# Patient Record
Sex: Male | Born: 1935 | Race: Black or African American | Hispanic: No | Marital: Single | Smoking: Never smoker
Health system: Southern US, Community
[De-identification: ages and names within clinical notes are randomized; demographics above are authoritative.]

## PROBLEM LIST (undated history)

## (undated) DIAGNOSIS — R4701 Aphasia: Secondary | ICD-10-CM

## (undated) DIAGNOSIS — N179 Acute kidney failure, unspecified: Secondary | ICD-10-CM

## (undated) DIAGNOSIS — N390 Urinary tract infection, site not specified: Secondary | ICD-10-CM

## (undated) DIAGNOSIS — I2510 Atherosclerotic heart disease of native coronary artery without angina pectoris: Secondary | ICD-10-CM

## (undated) DIAGNOSIS — N189 Chronic kidney disease, unspecified: Secondary | ICD-10-CM

## (undated) DIAGNOSIS — E785 Hyperlipidemia, unspecified: Secondary | ICD-10-CM

## (undated) DIAGNOSIS — H9190 Unspecified hearing loss, unspecified ear: Secondary | ICD-10-CM

## (undated) DIAGNOSIS — N401 Enlarged prostate with lower urinary tract symptoms: Secondary | ICD-10-CM

## (undated) DIAGNOSIS — D649 Anemia, unspecified: Secondary | ICD-10-CM

## (undated) DIAGNOSIS — R627 Adult failure to thrive: Secondary | ICD-10-CM

## (undated) DIAGNOSIS — K219 Gastro-esophageal reflux disease without esophagitis: Secondary | ICD-10-CM

## (undated) DIAGNOSIS — E875 Hyperkalemia: Secondary | ICD-10-CM

## (undated) DIAGNOSIS — I10 Essential (primary) hypertension: Secondary | ICD-10-CM

## (undated) DIAGNOSIS — H547 Unspecified visual loss: Secondary | ICD-10-CM

## (undated) DIAGNOSIS — C649 Malignant neoplasm of unspecified kidney, except renal pelvis: Secondary | ICD-10-CM

## (undated) HISTORY — DX: Atherosclerotic heart disease of native coronary artery without angina pectoris: I25.10

## (undated) HISTORY — DX: Unspecified visual loss: H54.7

## (undated) HISTORY — DX: Unspecified hearing loss, unspecified ear: H91.90

## (undated) HISTORY — DX: Hyperlipidemia, unspecified: E78.5

## (undated) HISTORY — DX: Aphasia: R47.01

## (undated) HISTORY — DX: Gastro-esophageal reflux disease without esophagitis: K21.9

## (undated) HISTORY — PX: OTHER SURGICAL HISTORY: SHX169

## (undated) HISTORY — DX: Malignant neoplasm of unspecified kidney, except renal pelvis: C64.9

## (undated) HISTORY — DX: Chronic kidney disease, unspecified: N18.9

## (undated) HISTORY — PX: CHOLECYSTECTOMY: SHX55

## (undated) HISTORY — DX: Essential (primary) hypertension: I10

---

## 2001-09-22 ENCOUNTER — Inpatient Hospital Stay (HOSPITAL_COMMUNITY): Admission: EM | Admit: 2001-09-22 | Discharge: 2001-09-24 | Payer: Self-pay | Admitting: Emergency Medicine

## 2001-09-22 ENCOUNTER — Encounter: Payer: Self-pay | Admitting: Emergency Medicine

## 2001-10-08 ENCOUNTER — Encounter: Admission: RE | Admit: 2001-10-08 | Discharge: 2001-10-08 | Payer: Self-pay

## 2001-10-15 ENCOUNTER — Ambulatory Visit (HOSPITAL_COMMUNITY): Admission: RE | Admit: 2001-10-15 | Discharge: 2001-10-15 | Payer: Self-pay | Admitting: Internal Medicine

## 2001-10-15 ENCOUNTER — Encounter: Admission: RE | Admit: 2001-10-15 | Discharge: 2001-10-15 | Payer: Self-pay | Admitting: Internal Medicine

## 2001-10-22 ENCOUNTER — Encounter: Admission: RE | Admit: 2001-10-22 | Discharge: 2001-10-22 | Payer: Self-pay | Admitting: Internal Medicine

## 2001-10-28 ENCOUNTER — Ambulatory Visit (HOSPITAL_COMMUNITY): Admission: RE | Admit: 2001-10-28 | Discharge: 2001-10-28 | Payer: Self-pay | Admitting: Internal Medicine

## 2001-10-28 ENCOUNTER — Encounter: Payer: Self-pay | Admitting: Internal Medicine

## 2001-10-29 ENCOUNTER — Encounter: Admission: RE | Admit: 2001-10-29 | Discharge: 2001-10-29 | Payer: Self-pay | Admitting: Internal Medicine

## 2001-11-02 ENCOUNTER — Encounter: Admission: RE | Admit: 2001-11-02 | Discharge: 2001-11-02 | Payer: Self-pay | Admitting: Internal Medicine

## 2001-11-02 ENCOUNTER — Ambulatory Visit (HOSPITAL_COMMUNITY): Admission: RE | Admit: 2001-11-02 | Discharge: 2001-11-02 | Payer: Self-pay | Admitting: Internal Medicine

## 2001-11-02 ENCOUNTER — Encounter: Payer: Self-pay | Admitting: Internal Medicine

## 2001-11-23 ENCOUNTER — Inpatient Hospital Stay (HOSPITAL_COMMUNITY): Admission: RE | Admit: 2001-11-23 | Discharge: 2001-11-28 | Payer: Self-pay | Admitting: Urology

## 2001-11-23 ENCOUNTER — Encounter: Payer: Self-pay | Admitting: Urology

## 2001-11-23 ENCOUNTER — Encounter (INDEPENDENT_AMBULATORY_CARE_PROVIDER_SITE_OTHER): Payer: Self-pay | Admitting: Specialist

## 2001-11-25 ENCOUNTER — Encounter: Payer: Self-pay | Admitting: Internal Medicine

## 2002-05-31 ENCOUNTER — Encounter: Admission: RE | Admit: 2002-05-31 | Discharge: 2002-05-31 | Payer: Self-pay | Admitting: Internal Medicine

## 2002-07-15 ENCOUNTER — Encounter: Admission: RE | Admit: 2002-07-15 | Discharge: 2002-07-15 | Payer: Self-pay | Admitting: Internal Medicine

## 2002-12-03 ENCOUNTER — Encounter: Admission: RE | Admit: 2002-12-03 | Discharge: 2002-12-03 | Payer: Self-pay | Admitting: Internal Medicine

## 2002-12-10 ENCOUNTER — Encounter: Admission: RE | Admit: 2002-12-10 | Discharge: 2002-12-10 | Payer: Self-pay | Admitting: Internal Medicine

## 2002-12-10 ENCOUNTER — Ambulatory Visit (HOSPITAL_COMMUNITY): Admission: RE | Admit: 2002-12-10 | Discharge: 2002-12-10 | Payer: Self-pay | Admitting: Internal Medicine

## 2002-12-14 ENCOUNTER — Encounter: Admission: RE | Admit: 2002-12-14 | Discharge: 2002-12-14 | Payer: Self-pay | Admitting: Internal Medicine

## 2003-08-04 ENCOUNTER — Encounter: Admission: RE | Admit: 2003-08-04 | Discharge: 2003-08-04 | Payer: Self-pay | Admitting: Internal Medicine

## 2003-08-04 ENCOUNTER — Ambulatory Visit (HOSPITAL_COMMUNITY): Admission: RE | Admit: 2003-08-04 | Discharge: 2003-08-04 | Payer: Self-pay | Admitting: Internal Medicine

## 2003-08-10 ENCOUNTER — Encounter: Admission: RE | Admit: 2003-08-10 | Discharge: 2003-08-10 | Payer: Self-pay | Admitting: Internal Medicine

## 2004-08-16 ENCOUNTER — Inpatient Hospital Stay (HOSPITAL_COMMUNITY): Admission: EM | Admit: 2004-08-16 | Discharge: 2004-08-30 | Payer: Self-pay | Admitting: Emergency Medicine

## 2004-08-16 ENCOUNTER — Ambulatory Visit: Payer: Self-pay | Admitting: Internal Medicine

## 2004-08-20 ENCOUNTER — Encounter (INDEPENDENT_AMBULATORY_CARE_PROVIDER_SITE_OTHER): Payer: Self-pay | Admitting: *Deleted

## 2004-08-22 ENCOUNTER — Encounter (INDEPENDENT_AMBULATORY_CARE_PROVIDER_SITE_OTHER): Payer: Self-pay | Admitting: Specialist

## 2004-09-04 ENCOUNTER — Ambulatory Visit: Payer: Self-pay | Admitting: Internal Medicine

## 2004-09-11 ENCOUNTER — Ambulatory Visit: Payer: Self-pay | Admitting: Internal Medicine

## 2004-09-18 ENCOUNTER — Ambulatory Visit: Payer: Self-pay | Admitting: Internal Medicine

## 2005-03-28 ENCOUNTER — Ambulatory Visit: Payer: Self-pay

## 2005-03-28 ENCOUNTER — Ambulatory Visit (HOSPITAL_COMMUNITY): Admission: RE | Admit: 2005-03-28 | Discharge: 2005-03-28 | Payer: Self-pay | Admitting: Internal Medicine

## 2005-04-04 ENCOUNTER — Ambulatory Visit: Payer: Self-pay | Admitting: Internal Medicine

## 2005-04-09 ENCOUNTER — Ambulatory Visit: Payer: Self-pay | Admitting: Internal Medicine

## 2005-08-28 ENCOUNTER — Ambulatory Visit: Payer: Self-pay | Admitting: Internal Medicine

## 2005-08-28 ENCOUNTER — Ambulatory Visit (HOSPITAL_COMMUNITY): Admission: RE | Admit: 2005-08-28 | Discharge: 2005-08-28 | Payer: Self-pay | Admitting: Internal Medicine

## 2005-09-05 ENCOUNTER — Ambulatory Visit (HOSPITAL_COMMUNITY): Admission: RE | Admit: 2005-09-05 | Discharge: 2005-09-05 | Payer: Self-pay | Admitting: *Deleted

## 2005-09-05 ENCOUNTER — Encounter (INDEPENDENT_AMBULATORY_CARE_PROVIDER_SITE_OTHER): Payer: Self-pay | Admitting: *Deleted

## 2005-09-09 ENCOUNTER — Ambulatory Visit: Payer: Self-pay | Admitting: Internal Medicine

## 2005-10-07 ENCOUNTER — Ambulatory Visit: Payer: Self-pay | Admitting: Internal Medicine

## 2005-10-16 ENCOUNTER — Ambulatory Visit: Payer: Self-pay | Admitting: Internal Medicine

## 2005-12-18 ENCOUNTER — Ambulatory Visit: Payer: Self-pay | Admitting: Internal Medicine

## 2006-01-06 ENCOUNTER — Ambulatory Visit: Payer: Self-pay | Admitting: Internal Medicine

## 2006-01-08 ENCOUNTER — Ambulatory Visit: Payer: Self-pay | Admitting: Internal Medicine

## 2006-01-15 ENCOUNTER — Ambulatory Visit: Payer: Self-pay | Admitting: Hospitalist

## 2006-03-24 ENCOUNTER — Inpatient Hospital Stay (HOSPITAL_COMMUNITY): Admission: EM | Admit: 2006-03-24 | Discharge: 2006-03-27 | Payer: Self-pay | Admitting: Emergency Medicine

## 2006-03-24 ENCOUNTER — Ambulatory Visit: Payer: Self-pay | Admitting: Internal Medicine

## 2006-03-25 ENCOUNTER — Encounter (INDEPENDENT_AMBULATORY_CARE_PROVIDER_SITE_OTHER): Payer: Self-pay | Admitting: Cardiology

## 2006-04-01 ENCOUNTER — Ambulatory Visit: Payer: Self-pay | Admitting: Internal Medicine

## 2006-04-08 ENCOUNTER — Encounter: Payer: Self-pay | Admitting: Internal Medicine

## 2006-04-10 ENCOUNTER — Ambulatory Visit: Payer: Self-pay | Admitting: Internal Medicine

## 2006-04-10 DIAGNOSIS — I2510 Atherosclerotic heart disease of native coronary artery without angina pectoris: Secondary | ICD-10-CM | POA: Insufficient documentation

## 2006-04-10 DIAGNOSIS — R197 Diarrhea, unspecified: Secondary | ICD-10-CM

## 2006-04-10 DIAGNOSIS — H913 Deaf nonspeaking, not elsewhere classified: Secondary | ICD-10-CM

## 2006-04-10 DIAGNOSIS — K219 Gastro-esophageal reflux disease without esophagitis: Secondary | ICD-10-CM

## 2006-04-10 DIAGNOSIS — E7800 Pure hypercholesterolemia, unspecified: Secondary | ICD-10-CM

## 2006-04-10 DIAGNOSIS — K8050 Calculus of bile duct without cholangitis or cholecystitis without obstruction: Secondary | ICD-10-CM | POA: Insufficient documentation

## 2006-04-10 DIAGNOSIS — I10 Essential (primary) hypertension: Secondary | ICD-10-CM | POA: Insufficient documentation

## 2006-04-10 DIAGNOSIS — Z9089 Acquired absence of other organs: Secondary | ICD-10-CM

## 2006-04-10 DIAGNOSIS — H543 Unqualified visual loss, both eyes: Secondary | ICD-10-CM | POA: Insufficient documentation

## 2006-04-10 HISTORY — DX: Atherosclerotic heart disease of native coronary artery without angina pectoris: I25.10

## 2006-11-13 ENCOUNTER — Telehealth (INDEPENDENT_AMBULATORY_CARE_PROVIDER_SITE_OTHER): Payer: Self-pay | Admitting: *Deleted

## 2007-01-22 ENCOUNTER — Telehealth (INDEPENDENT_AMBULATORY_CARE_PROVIDER_SITE_OTHER): Payer: Self-pay | Admitting: *Deleted

## 2007-01-30 ENCOUNTER — Encounter (INDEPENDENT_AMBULATORY_CARE_PROVIDER_SITE_OTHER): Payer: Self-pay | Admitting: *Deleted

## 2007-01-30 ENCOUNTER — Ambulatory Visit: Payer: Self-pay | Admitting: Infectious Disease

## 2007-01-30 DIAGNOSIS — R4182 Altered mental status, unspecified: Secondary | ICD-10-CM

## 2007-01-30 LAB — CONVERTED CEMR LAB
ALT: <8 units/L (ref 0–53)
AST: 11 units/L (ref 0–37)
Albumin: 4.2 g/dL (ref 3.5–5.2)
Alkaline Phosphatase: 63 units/L (ref 39–117)
BUN: 15 mg/dL (ref 6–23)
Basophils Absolute: 0 10*3/uL (ref 0.0–0.1)
Basophils Relative: 0 % (ref 0–1)
CO2: 27 meq/L (ref 19–32)
Calcium: 9.5 mg/dL (ref 8.4–10.5)
Chloride: 104 meq/L (ref 96–112)
Creatinine, Ser: 1.01 mg/dL (ref 0.40–1.50)
Eosinophils Absolute: 0.1 10*3/uL (ref 0.0–0.7)
Eosinophils Relative: 1 % (ref 0–5)
Glucose, Bld: 85 mg/dL (ref 70–99)
HCT: 45.9 % (ref 39.0–52.0)
Hemoglobin: 15.6 g/dL (ref 13.0–17.0)
Lymphocytes Relative: 30 % (ref 12–46)
Lymphs Abs: 2 10*3/uL (ref 0.7–3.3)
MCHC: 34 g/dL (ref 30.0–36.0)
MCV: 86.6 fL (ref 78.0–100.0)
Monocytes Absolute: 0.5 10*3/uL (ref 0.2–0.7)
Monocytes Relative: 7 % (ref 3–11)
Neutro Abs: 4.2 10*3/uL (ref 1.7–7.7)
Neutrophils Relative %: 62 % (ref 43–77)
Platelets: 210 10*3/uL (ref 150–400)
Potassium: 4 meq/L (ref 3.5–5.3)
RBC: 5.3 M/uL (ref 4.22–5.81)
RDW: 13.9 % (ref 11.5–14.0)
Sed Rate: 13 mm/hr (ref 0–16)
Sodium: 142 meq/L (ref 135–145)
Total Bilirubin: 1 mg/dL (ref 0.3–1.2)
Total Protein: 7.3 g/dL (ref 6.0–8.3)
WBC: 6.8 10*3/uL (ref 4.0–10.5)

## 2007-02-03 ENCOUNTER — Ambulatory Visit (HOSPITAL_COMMUNITY): Admission: RE | Admit: 2007-02-03 | Discharge: 2007-02-03 | Payer: Self-pay | Admitting: *Deleted

## 2007-02-06 ENCOUNTER — Encounter (INDEPENDENT_AMBULATORY_CARE_PROVIDER_SITE_OTHER): Payer: Self-pay | Admitting: *Deleted

## 2007-02-06 ENCOUNTER — Ambulatory Visit: Payer: Self-pay | Admitting: Internal Medicine

## 2007-02-07 DIAGNOSIS — C649 Malignant neoplasm of unspecified kidney, except renal pelvis: Secondary | ICD-10-CM

## 2007-02-07 LAB — CONVERTED CEMR LAB
Free T4: 1.46 ng/dL (ref 0.89–1.80)
TSH: 1.492 microintl units/mL (ref 0.350–5.50)
Vitamin B-12: 464 pg/mL (ref 211–911)

## 2007-04-15 ENCOUNTER — Encounter: Payer: Self-pay | Admitting: Internal Medicine

## 2007-05-29 ENCOUNTER — Telehealth: Payer: Self-pay | Admitting: Internal Medicine

## 2007-06-03 ENCOUNTER — Telehealth: Payer: Self-pay | Admitting: Internal Medicine

## 2007-11-09 ENCOUNTER — Encounter (INDEPENDENT_AMBULATORY_CARE_PROVIDER_SITE_OTHER): Payer: Self-pay | Admitting: *Deleted

## 2007-11-09 ENCOUNTER — Ambulatory Visit: Payer: Self-pay | Admitting: Internal Medicine

## 2007-11-09 DIAGNOSIS — K625 Hemorrhage of anus and rectum: Secondary | ICD-10-CM

## 2007-11-09 DIAGNOSIS — R319 Hematuria, unspecified: Secondary | ICD-10-CM

## 2007-11-09 DIAGNOSIS — R195 Other fecal abnormalities: Secondary | ICD-10-CM

## 2007-11-09 LAB — CONVERTED CEMR LAB
Basophils Absolute: 0 10*3/uL (ref 0.0–0.1)
Basophils Relative: 0 % (ref 0–1)
Eosinophils Absolute: 0.1 10*3/uL (ref 0.0–0.7)
Eosinophils Relative: 1 % (ref 0–5)
HCT: 46 % (ref 39.0–52.0)
Hemoglobin: 15.9 g/dL (ref 13.0–17.0)
Lymphocytes Relative: 42 % (ref 12–46)
Lymphs Abs: 3.6 10*3/uL (ref 0.7–4.0)
MCHC: 34.6 g/dL (ref 30.0–36.0)
MCV: 88.7 fL (ref 78.0–100.0)
Monocytes Absolute: 0.8 10*3/uL (ref 0.1–1.0)
Monocytes Relative: 9 % (ref 3–12)
Neutro Abs: 4.2 10*3/uL (ref 1.7–7.7)
Neutrophils Relative %: 48 % (ref 43–77)
Platelets: 157 10*3/uL (ref 150–400)
RBC: 5.19 M/uL (ref 4.22–5.81)
RDW: 13.9 % (ref 11.5–15.5)
WBC: 8.7 10*3/uL (ref 4.0–10.5)

## 2007-11-12 ENCOUNTER — Ambulatory Visit (HOSPITAL_COMMUNITY): Admission: RE | Admit: 2007-11-12 | Discharge: 2007-11-12 | Payer: Self-pay | Admitting: *Deleted

## 2007-11-22 ENCOUNTER — Encounter: Payer: Self-pay | Admitting: Internal Medicine

## 2007-11-30 ENCOUNTER — Telehealth: Payer: Self-pay | Admitting: Internal Medicine

## 2008-08-09 IMAGING — CT CT CERVICAL SPINE W/O CM
3 of 6 series · 11 of 28 positions shown, 12 images · IV contrast (agent unspecified)
Comparison: none

CLINICAL DATA: Altered level of consciousness and syncope.
 HEAD CT WITHOUT CONTRAST:
TECHNIQUE: Contiguous axial images were obtained from the base of the skull through the vertex according to standard protocol without contrast.
TECHNIQUE: Multidetector CT imaging of the cervical spine was performed.  Multiplanar CT  image reconstructions were also generated.

[Series 5: cervical spine · axial · 0.27mm/px · z∈[+115,+220]mm · 3 of 84 slices shown, 4 images]
[im 21/84  soft-tissue]
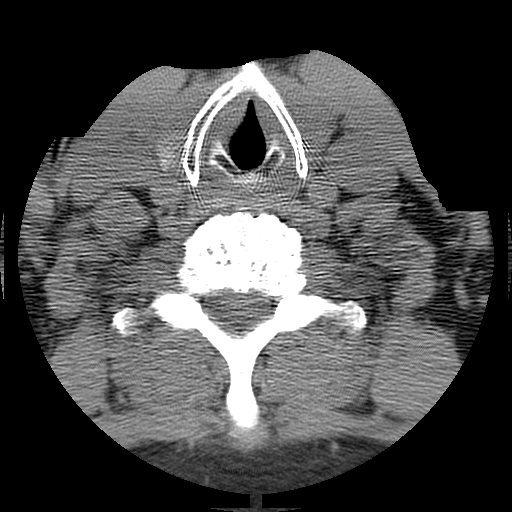
[im 21/84  bone]
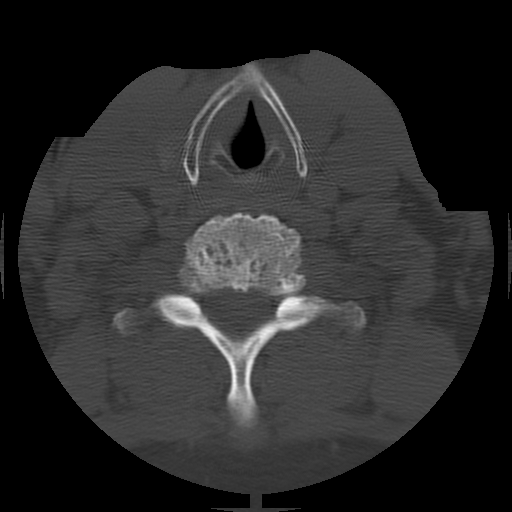
[im 42/84  bone]
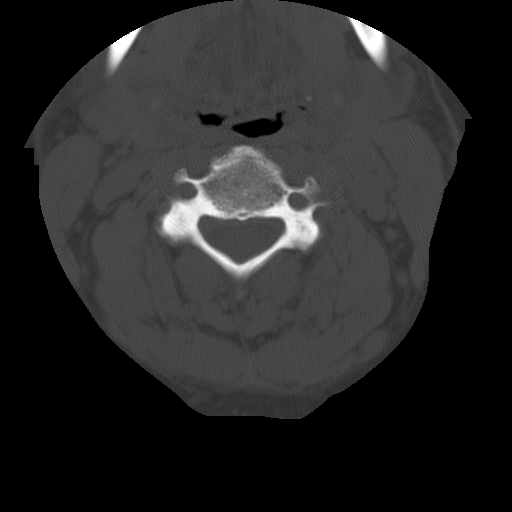
[im 63/84  bone]
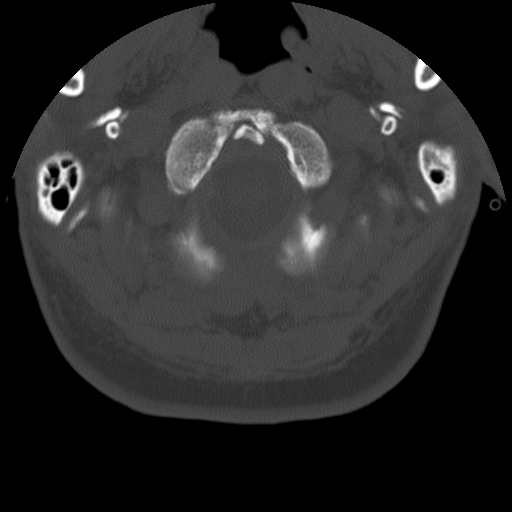

[Series 6: recon 2: cervical spine · axial · 0.27mm/px · z∈[+115,+220]mm · 3 of 84 slices shown]
[im 21/84  bone]
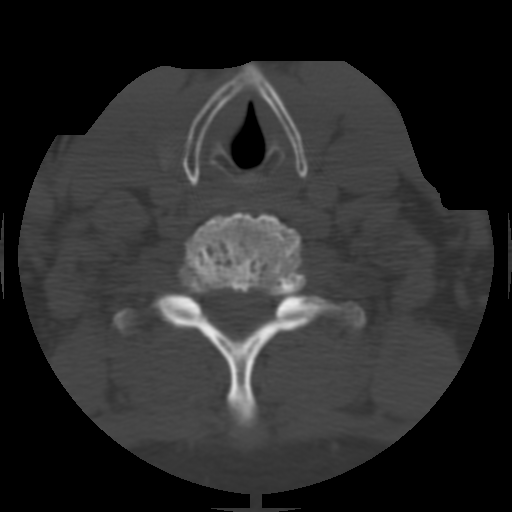
[im 42/84  bone]
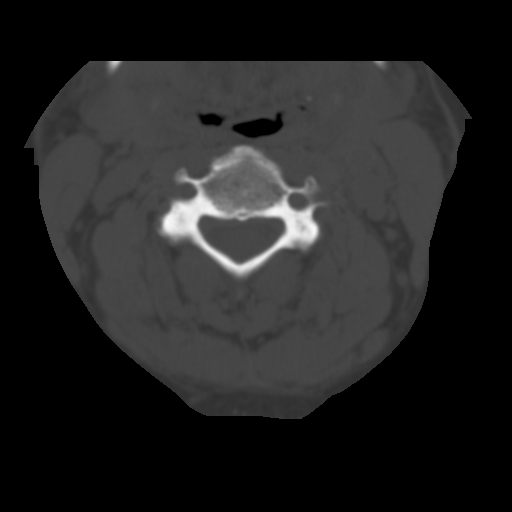
[im 63/84  bone]
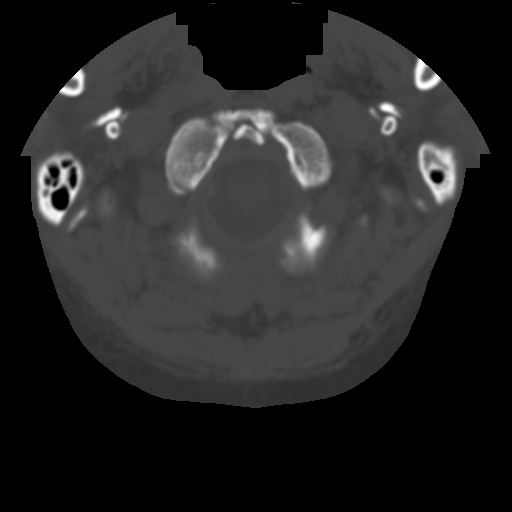

[Series 700: reformatted · sagittal · 0.42mm/px · 5 of 41 slices shown]
[im 7/41  bone]
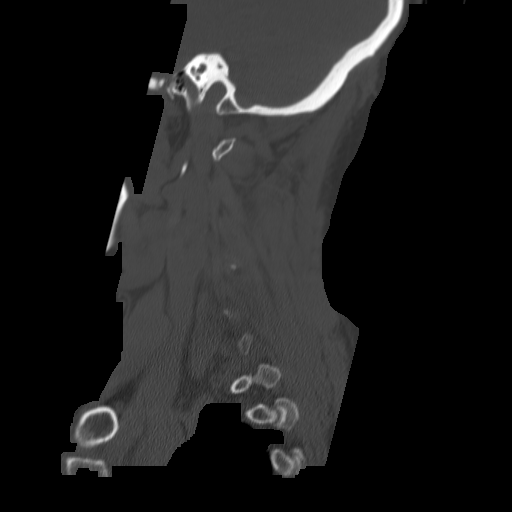
[im 14/41  bone]
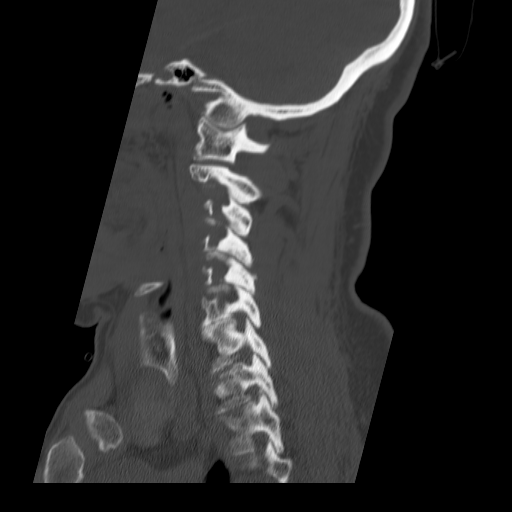
[im 21/41  bone]
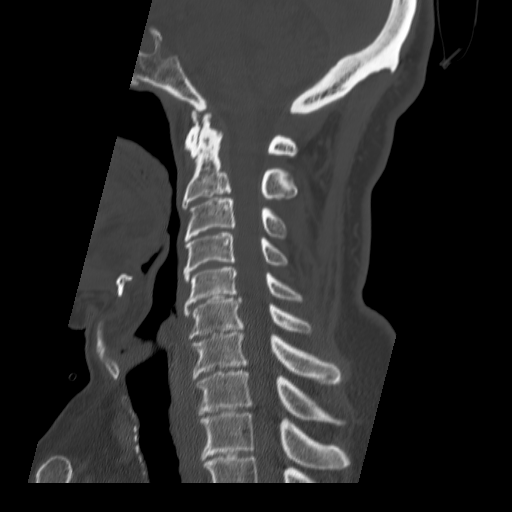
[im 27/41  bone]
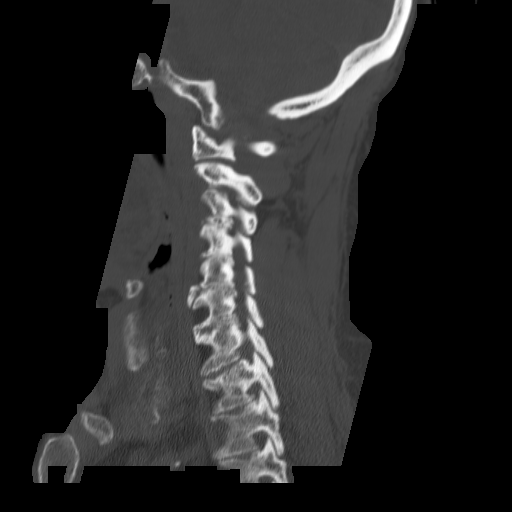
[im 34/41  bone]
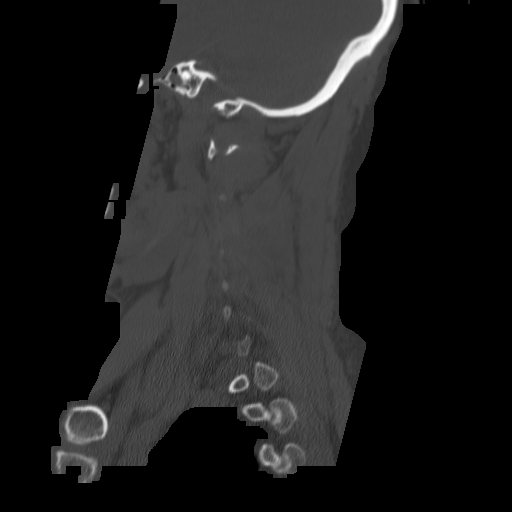

[11 of 28 positions shown; findings below may reference images not displayed]

FINDINGS: There is mild age-related atrophy.  There are mild chronic small vessel changes in the deep white matter.  No evidence of acute stroke, mass, hemorrhage, hydrocephalus or extraaxial collection.  The calvarium is intact.  The visualized paranasal sinuses, middle ears and mastoids are clear.
IMPRESSION: No acute findings.  
 CERVICAL SPINE CT WITHOUT CONTRAST:
FINDINGS: There is chronic degenerative spondylosis and facet arthropathy.  There is foraminal encroachment at multiple levels throughout the mid cervical region.
IMPRESSION: Chronic degenerative changes.  No sign of acute injury.

## 2008-08-09 IMAGING — CR DG THORACIC SPINE 2V
3 series · 3 of 3 positions shown · non-contrast
Comparison: 03/28/05.

CLINICAL DATA: Syncope, back pain, fever.
 CHEST - 2 VIEW:

[t t-spine lat]
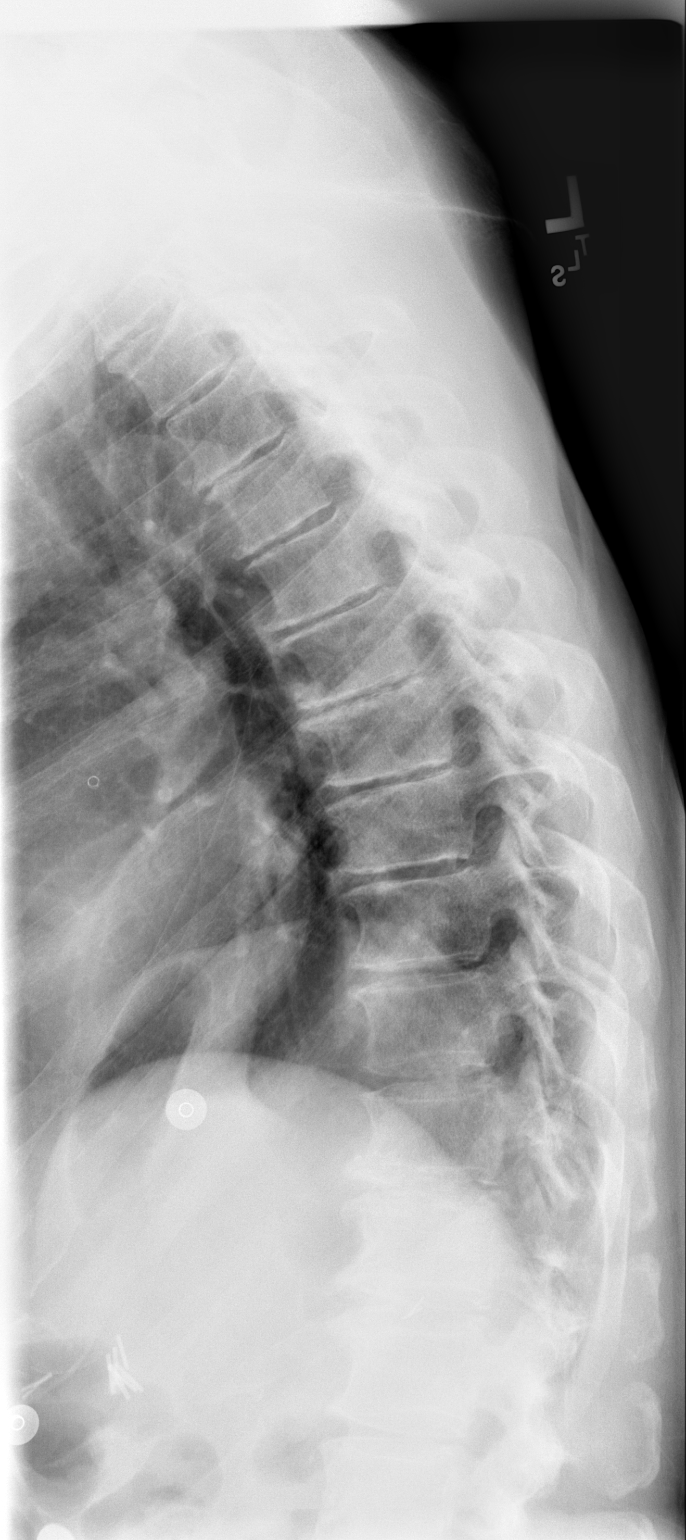

[t swimmers]
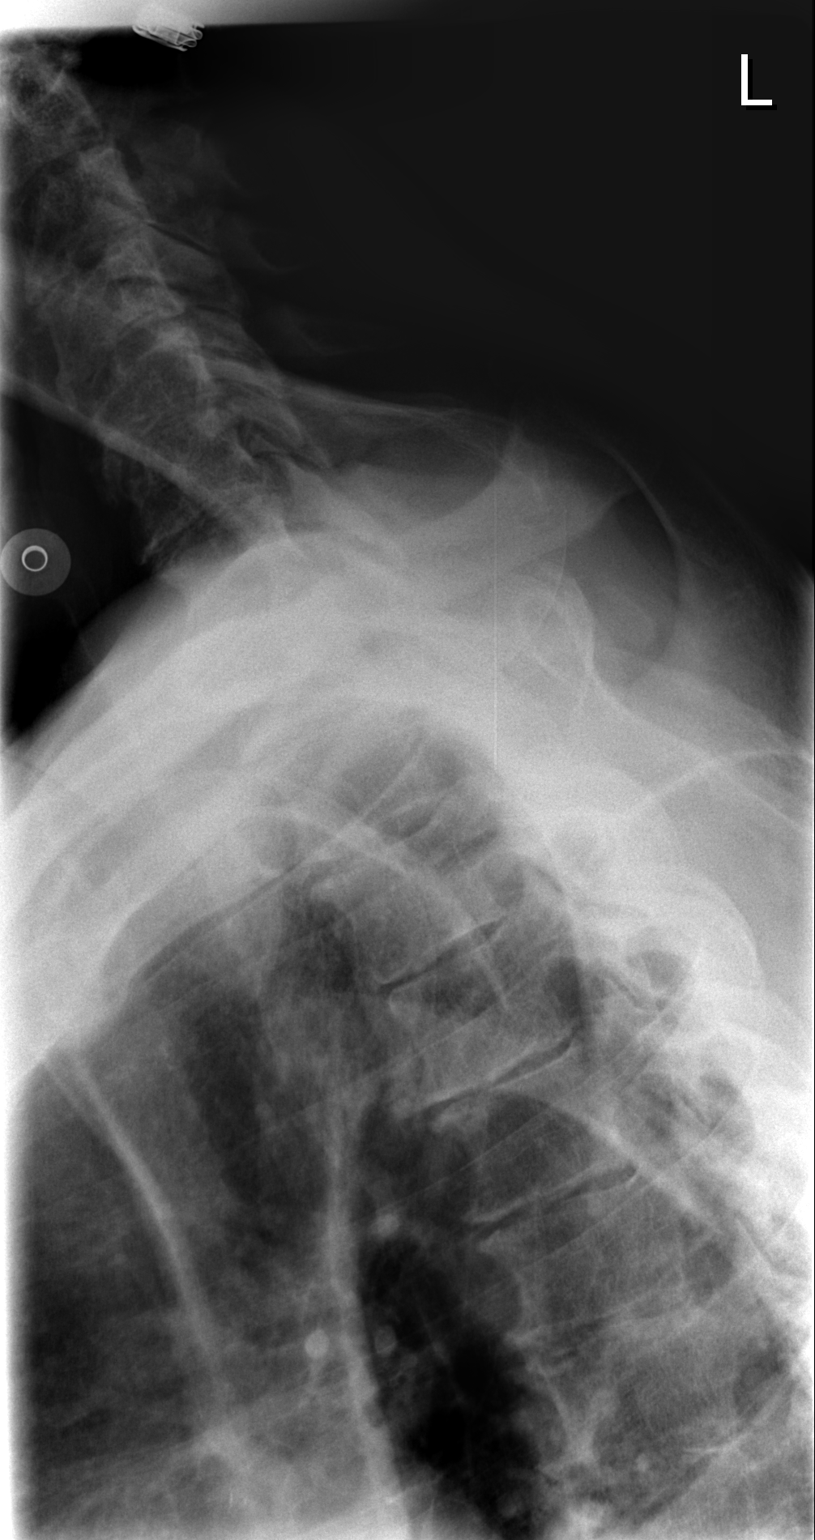

[t t-spine a.p.]
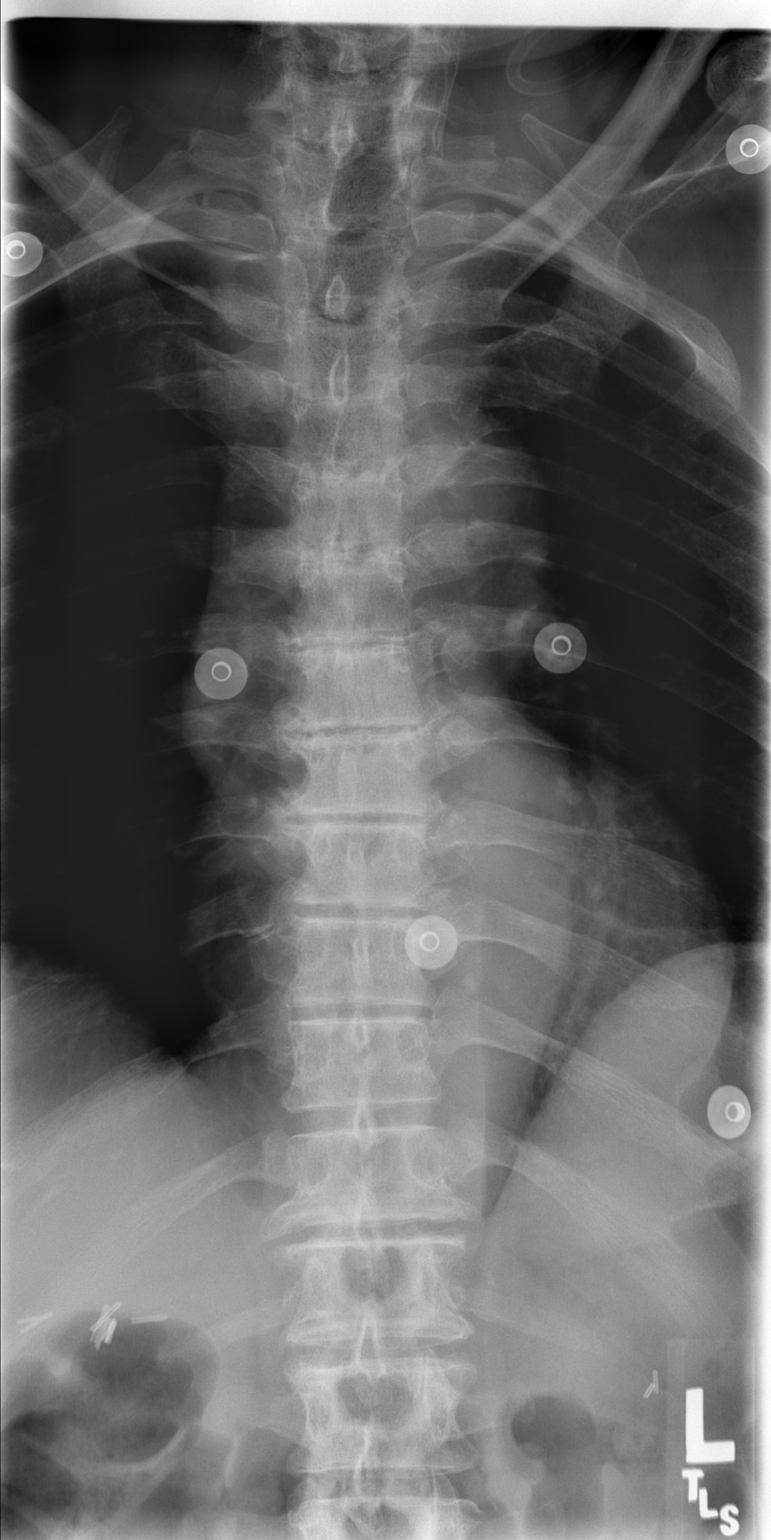

[3 of 3 positions shown; findings below may reference images not displayed]

FINDINGS: Heart size is normal.  The mediastinum is unremarkable.  There is mild scarring at the lung bases.  No evidence of active infiltrate, mass, effusion or collapse.
IMPRESSION: Basilar scarring.  No active disease appreciated.  
 THORACIC SPINE ? 2 VIEW:
FINDINGS: There are changes of degenerative disc disease in the mid thoracic spine.  I do not see any evidence of fracture, focal destructive lesion or plain radiographic evidence of spinal infection.
IMPRESSION: Chronic degenerative changes.  
 LUMBAR SPINE ? 4 VIEW:
FINDINGS: There is scoliosis convex to the left.  There is degenerative facet arthropathy in the lower lumbar spine with 1 or 2 mm of anterolisthesis at L4-5.  There is disc space narrowing of a mild degree at L4-5 and L5-S1.
IMPRESSION: Lower lumbar degenerative changes as described.

## 2009-03-07 ENCOUNTER — Encounter (INDEPENDENT_AMBULATORY_CARE_PROVIDER_SITE_OTHER): Payer: Self-pay | Admitting: Internal Medicine

## 2009-03-07 ENCOUNTER — Observation Stay (HOSPITAL_COMMUNITY): Admission: EM | Admit: 2009-03-07 | Discharge: 2009-03-15 | Payer: Self-pay | Admitting: Emergency Medicine

## 2009-03-07 ENCOUNTER — Ambulatory Visit: Payer: Self-pay | Admitting: Internal Medicine

## 2009-03-09 ENCOUNTER — Encounter (INDEPENDENT_AMBULATORY_CARE_PROVIDER_SITE_OTHER): Payer: Self-pay | Admitting: Internal Medicine

## 2009-04-06 ENCOUNTER — Ambulatory Visit: Payer: Self-pay | Admitting: Internal Medicine

## 2009-04-06 DIAGNOSIS — R079 Chest pain, unspecified: Secondary | ICD-10-CM

## 2009-04-07 ENCOUNTER — Telehealth: Payer: Self-pay | Admitting: Licensed Clinical Social Worker

## 2009-04-08 LAB — CONVERTED CEMR LAB
ALT: 13 units/L (ref 0–53)
AST: 15 units/L (ref 0–37)
Albumin: 4.5 g/dL (ref 3.5–5.2)
Alkaline Phosphatase: 60 units/L (ref 39–117)
BUN: 14 mg/dL (ref 6–23)
CO2: 23 meq/L (ref 19–32)
Calcium: 9.3 mg/dL (ref 8.4–10.5)
Chloride: 101 meq/L (ref 96–112)
Creatinine, Ser: 1.01 mg/dL (ref 0.40–1.50)
Glucose, Bld: 102 mg/dL — ABNORMAL HIGH (ref 70–99)
Potassium: 4.4 meq/L (ref 3.5–5.3)
Sodium: 139 meq/L (ref 135–145)
Total Bilirubin: 1.4 mg/dL — ABNORMAL HIGH (ref 0.3–1.2)
Total Protein: 7.5 g/dL (ref 6.0–8.3)

## 2009-04-10 ENCOUNTER — Encounter (INDEPENDENT_AMBULATORY_CARE_PROVIDER_SITE_OTHER): Payer: Self-pay | Admitting: Internal Medicine

## 2009-04-17 ENCOUNTER — Telehealth: Payer: Self-pay | Admitting: Licensed Clinical Social Worker

## 2010-09-28 LAB — CARDIAC PANEL(CRET KIN+CKTOT+MB+TROPI)
CK, MB: 1 ng/mL (ref 0.3–4.0)
CK, MB: 1.3 ng/mL (ref 0.3–4.0)
Relative Index: INVALID (ref 0.0–2.5)
Total CK: 63 U/L (ref 7–232)
Troponin I: 0.03 ng/mL (ref 0.00–0.06)

## 2010-09-28 LAB — COMPREHENSIVE METABOLIC PANEL
ALT: 10 U/L (ref 0–53)
Alkaline Phosphatase: 61 U/L (ref 39–117)
BUN: 13 mg/dL (ref 6–23)
CO2: 27 mEq/L (ref 19–32)
GFR calc non Af Amer: >60 mL/min (ref >60)
Glucose, Bld: 100 mg/dL — ABNORMAL HIGH (ref 70–99)
Potassium: 3.6 mEq/L (ref 3.5–5.1)
Total Bilirubin: 0.9 mg/dL (ref 0.3–1.2)
Total Protein: 7.1 g/dL (ref 6.0–8.3)

## 2010-09-28 LAB — POCT CARDIAC MARKERS
CKMB, poc: 1.6 ng/mL (ref 1.0–8.0)
Myoglobin, poc: 107 ng/mL (ref 12–200)
Troponin i, poc: 0.06 ng/mL (ref 0.00–0.09)

## 2010-09-28 LAB — POCT I-STAT, CHEM 8
BUN: 20 mg/dL (ref 6–23)
Creatinine, Ser: 1.1 mg/dL (ref 0.4–1.5)
Hemoglobin: 16 g/dL (ref 13.0–17.0)
Potassium: 3.7 mEq/L (ref 3.5–5.1)
Sodium: 140 mEq/L (ref 135–145)

## 2010-09-28 LAB — URINALYSIS, ROUTINE W REFLEX MICROSCOPIC
Bilirubin Urine: NEGATIVE
Glucose, UA: NEGATIVE mg/dL
Hgb urine dipstick: NEGATIVE
Ketones, ur: NEGATIVE mg/dL
Specific Gravity, Urine: 1.011 (ref 1.005–1.030)
pH: 6.5 (ref 5.0–8.0)

## 2010-09-28 LAB — DIFFERENTIAL
Basophils Absolute: 0 10*3/uL (ref 0.0–0.1)
Eosinophils Absolute: 0.1 10*3/uL (ref 0.0–0.7)
Monocytes Relative: 8 % (ref 3–12)
Neutro Abs: 5.2 10*3/uL (ref 1.7–7.7)

## 2010-09-28 LAB — CK TOTAL AND CKMB (NOT AT ARMC): Total CK: 79 U/L (ref 7–232)

## 2010-09-28 LAB — BASIC METABOLIC PANEL
BUN: 14 mg/dL (ref 6–23)
Chloride: 107 mEq/L (ref 96–112)
Creatinine, Ser: 1.02 mg/dL (ref 0.4–1.5)
GFR calc Af Amer: >60 mL/min (ref >60)
GFR calc non Af Amer: >60 mL/min (ref >60)
Potassium: 3.7 mEq/L (ref 3.5–5.1)

## 2010-09-28 LAB — CBC
HCT: 37.5 % — ABNORMAL LOW (ref 39.0–52.0)
HCT: 44.8 % (ref 39.0–52.0)
Hemoglobin: 15.3 g/dL (ref 13.0–17.0)
MCHC: 34 g/dL (ref 30.0–36.0)
MCV: 90.1 fL (ref 78.0–100.0)
MCV: 90.7 fL (ref 78.0–100.0)
Platelets: 129 10*3/uL — ABNORMAL LOW (ref 150–400)
Platelets: 160 10*3/uL (ref 150–400)
RBC: 4.95 MIL/uL (ref 4.22–5.81)
RDW: 14 % (ref 11.5–15.5)
RDW: 14 % (ref 11.5–15.5)
WBC: 7 10*3/uL (ref 4.0–10.5)
WBC: 7.8 10*3/uL (ref 4.0–10.5)

## 2010-09-28 LAB — RAPID URINE DRUG SCREEN, HOSP PERFORMED
Benzodiazepines: NOT DETECTED
Cocaine: NOT DETECTED
Opiates: NOT DETECTED
Tetrahydrocannabinol: NOT DETECTED

## 2010-09-28 LAB — LIPID PANEL
Cholesterol: 141 mg/dL (ref 0–200)
HDL: 44 mg/dL (ref >39)

## 2010-09-28 LAB — HEMOGLOBIN A1C: Hgb A1c MFr Bld: 6.1 % (ref 4.6–6.1)

## 2010-09-28 LAB — D-DIMER, QUANTITATIVE: D-Dimer, Quant: 0.6 ug/mL-FEU — ABNORMAL HIGH (ref 0.00–0.48)

## 2010-11-09 NOTE — Discharge Summary (Signed)
NAMELEJON, AFZAL NO.:  [id number]   MEDICAL RECORD NO.:  [id number]          PATIENT TYPE:  INP   LOCATION:  4741                         FACILITY:  [facility]   PHYSICIAN:  Yetta Barre, M.D.  DATE OF BIRTH:  [birth year]   DATE OF ADMISSION:  03/24/2006  DATE OF DISCHARGE:  03/27/2006                                 DISCHARGE SUMMARY   DISCHARGE DIAGNOSES:  1. Syncope.  2. Altered mental status, resolved.  3. Acute renal failure, resolved.  4. Hypertension.  5. Congenital deafness and blindness.  6. Coronary artery disease.  7. Renal cell carcinoma, status post nephrectomy.  8. Hyponatremia, secondary to syndrome of inappropriate antidiuretic      hormone.  9. History of enlarged prostate.   DISCHARGE MEDICATIONS:  1. Lotrel 10/20 daily.  2. Toprol-XL 300 mg daily.  3. Protonix 40 mg daily.  4. Please note, that Lasix was discontinued during the hospital course      because we felt that it was contributing to hypovolemia and possible      syncope.   DISPOSITION AND FOLLOWUP:  The patient will return to the [facility] Medicine [facility] and he has an appointment with Dr. Renae Fickle on Tuesday,  October 9 at 10:45.  The patient will also see Dr. Retta Diones in urology to  followup on enlarged prostate on October 16 at 10:30 a.m.   PROCEDURES PERFORMED:  1. The patient had spine films that showed lower lumbar degenerative      changes, but no signs of acute injury.  2. The patient had a CT of the head that showed chronic degenerative      changes, no acute injury.  3. The patient had an ultrasound that showed no evidence of      hydronephrosis.  The prostate was enlarged with irregularity of its      contour.  4. Two-D echocardiogram showed an ejection fraction between 55% to 65%.      There was no changes from the previous echocardiogram report from the      15th of March, 2007.   CONSULTATIONS:  There were no consultations made.   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  The patient was finishing his breakfast, when he suddenly fell to the ground  from his chair, as per caretaker who witnessed the event.  There was no  report of trauma, tripping, seizure activity or loss of sphincter tone.  There was reported loss of consciousness for a few seconds, but the  caretaker was not certain about the time.  After the acute event, the  patient was disoriented and per caretaker not making any sense.  The patient  had, otherwise, been functioning at his baseline up to this point and was  not sick aside from an  episode of disorientation which later subsided.   PHYSICAL EXAMINATION:  VITAL SIGNS:  Temperature 97.3.  Blood pressure  151/76.  Pulse 78.  Respiratory rate 16.  O2 saturation 98% on room air.  GENERAL:  The patient was not in any acute distress.  He was resting  comfortably.  HEENT:  His eyes were nonicteric.  Pupils were round and reactive.  His  oropharynx was clear.  NECK:  His neck was supple.  LUNGS:  His lungs were clear to auscultation bilaterally with good air  movement.  CARDIOVASCULAR:  The patient did not have any carotid bruits.  No evidence  of JVD.  Regular rate and rhythm.  No murmurs.  GI:  His GI exam was soft, nontender with no palpable masses.  EXTREMITY EXAM:  The patient did not any cyanosis.  He had 2+ bilateral  pitting edema in the feet and ankles.  His pulses were 2+ pedal and  posterior tibial.  There is no calf tenderness, edema, redness or warmth.  The patient was able to move all 4 extremities.  NEUROLOGIC:  Per caretaker  reports, the person was currently oriented in person, but  did not know where  he was despite having been told so earlier.   ADMISSION LABS:  Patient had a CBC done, white count 13.1, hemoglobin 14.8,  platelets 255.  Chemistries:  Sodium 138, potassium 2.9, chloride 107,  bicarb 22, BUN 21, creatinine 1.5, glucose of 146.  He had a PT of 13.8, INR  of 1.0, PTT of 39.0.  His urinalysis was normal.  His cardiac enzymes were  negative x3.  Chest x-ray was done that showed no acute lung process.  His  EKG was unchanged from a prior obtained EKG.  His pheno was calculated at  3.14%.   HOSPITAL COURSE:  1. Syncope.  The patient had a through cardiovascular and neurological      workup for causes of syncope.  No clear cardiovascular or neurological      abnormality could be identified.  The patient did not have any other      episodes of syncope during his hospital stay.  He was hydrated and      given the fact that his creatinine, during admission, was 1.5 and had      come down during his hospital stay, we believe that the patient's      syncope was likely contributed by a hypovolemic status.  2. Acute mental status change.  The patient's family was confident that      the patient was back to his normal level of mental status.  An      interpreter was brought in to help [place] communicate with the patient and      it appeared that the person was oriented to person and place.  The      patient's family felt comfortable in discharging him from the hospital.  3. Acute renal failure.  The patient's creatinine came back down and this      resolved with hydration.  4. Hypertension.  This was stable during the patient's hospital course.      We discontinued Lasix because we felt that it might have been      contributing to the patient's hypovolemic status.  5. Enlarged prostate seen on ultrasound.  We have scheduled the followup      appointment with urology to address this issue.  DISCHARGE LABS AND VITALS:  Vital signs:  T-Max 97.6.  Blood pressure  120/80.  Pulse 75.  Respirations 20.  O2 saturation 100% on room air.  Labs:  White count 7.9, hemoglobin 12.7, platelets 191.  Chemistries:  Sodium 138,  potassium 3.3, chloride 107, bicarb 26, BUN 10, creatinine 1.1.      Yetta Barre, M.D.  Electronically Signed    SS/MEDQ  D:  03/28/2006  T:  03/29/2006  Job:  [id number]

## 2010-11-09 NOTE — Discharge Summary (Signed)
[facility]  Patient:    Alexander Rosales, Alexander Rosales Visit Number: [id number] MRN: [id number]          Service Type: SUR Location: 3W 0354 01 Attending Physician:  Lindaann Slough Dictated by:   Lindaann Slough, M.D. Admit Date:  11/23/2001 Discharge Date: 11/28/2001   CC:         [facility]   Discharge Summary  DISCHARGE DIAGNOSES: 1. Left renal oncocytoma. 2. Hypertension. 3. Coronary artery disease. 4. Prostatic nodule. 5. Elevated prostate-specific antigen.  HISTORY OF PRESENT ILLNESS:  The patient is a 77 year old male, blind and deaf.  He was found on CT scan to have a 3 cm exophytic mass in the mid to upper pole of the left kidney.  The patient was admitted on November 23, 2001, for partial nephrectomy.  PHYSICAL EXAMINATION:  VITAL SIGNS:  Blood pressure 140/90, pulse 68, respiratory rate 18, temperature 97, weight 103 pounds, height 5 feet 5 inches.  HEENT:  Head normal.  Ears, nose and throat within normal limits.  NECK:  Supple.  No cervical lymph nodes, no thyromegaly.  CHEST:  Symmetrical.  LUNGS:  Fully expanded and clear to auscultation and percussion.  HEART:  Regular rate and rhythm.  ABDOMEN:  Soft, nondistended, nontender.  Liver spleen and kidneys not palpable.  No organomegaly.  Bowel sounds normal.  GENITALIA:  Penis uncircumcised.  Meatus normal.  Scrotum unremarkable. Testicles, cords and epididymis within normal limits.  RECTAL:  Sphincter tone is normal.  Prostate enlarged with a nodule on the left lobe of the prostate.  LABORATORY DATA AND X-RAY FINDINGS:  Hemoglobin on admission 13.9, hematocrit 39.9 and WBC 8.3.  Sodium 140, potassium 3.6, BUN 10, creatinine 1.1. Urinalysis normal.  Urine culture showed insignificant growth.  EKG showed left ventricular hypertrophy.  Chest x-ray showed no evidence of active disease.  HOSPITAL COURSE:  The patient had a left partial nephrectomy on November 23, 2001. Postoperatively, he had a 25% pneumothorax on the left side that was untreated without surgical intervention.  He had episodes of hypertension that was treated with IV labetalol.  Consultation with hospitalist was obtained and the patient was treated with clonidine and Vasotec.  He was then treated with labetalol 200 mg three times a day.  His blood pressure came down with that regimen.  He tolerated his diet well and voided well.  His urine was clear. He was then discharged home on November 28, 2001.  DISCHARGE MEDICATIONS: 1. Labetalol 200 mg three times a day. 2. Clonidine patch, apply patch each week. 3. Lotensin 20 mg p.o. daily. 4. Keflex 250 mg every eight hours. 5. Percocet one tablet every four hours p.r.n. pain.  DIET:  Low sodium, low fat diet.  CONDITION ON DISCHARGE:  Improved.  ACTIVITY:  Resume prehospital activities.  FOLLOWUP:  He will be followed by myself and [facility]. Dictated by:   Lindaann Slough, M.D. Attending Physician:  Lindaann Slough DD:  12/11/01 TD:  12/14/01 Job: [id number]

## 2010-11-09 NOTE — H&P (Signed)
[facility]  Patient:    Alexander Rosales, MASSI Visit Number: [id number] MRN: [id number]          Service Type: SUR Location: 1W 0155 01 Attending Physician:  Lindaann Slough Dictated by:   Lindaann Slough, M.D. Admit Date:  11/23/2001                           History and Physical  CHIEF COMPLAINT:  Left renal mass.  HISTORY OF PRESENT ILLNESS:  The patient is a 77 year old male who was found on CT scan and MRA to have a 3 cm exophytic mass in the mid to upper pole of the left kidney.  He also has nocturia x2-3.  He has no frequency, hesitancy, dysuria, or gross hematuria.  The patient is deaf and blind.  Treatment options were discussed with the patient and his sister through an interpreter and he is scheduled today for left partial nephrectomy.  PAST MEDICAL HISTORY: 1. The patient has a history of hypertension. 2. Had a cardiac catheterization done in the past. 3. He is blind and deaf.  ALLERGIES:  He is allergic to ASPIRIN.  FAMILY HISTORY:  Father and mother died of unknown causes to the patient.  One sister died of throat cancer and he has one sister living.  He is single and does not have any children.  SOCIAL HISTORY:  He does not smoke or drink.  MEDICATIONS: 1. He takes Prevacid 30 mg p.o. daily. 2. Hydrochlorothiazide 25 mg p.o. daily. 3. Toprol XL 100 mg p.o. daily. 4. Lotensin 20 mg p.o. daily.  REVIEW OF SYSTEMS:  No cough, no shortness of breath, no hemoptysis. CARDIOVASCULAR:  No palpitations, no chest pain.  GASTROINTESTINAL:  No nausea, no vomiting, no diarrhea, or constipation.  GENITOURINARY:  As per history.  PHYSICAL EXAMINATION:  VITAL SIGNS:  His blood pressure is 140/90, pulse 68, respirations 18, temperature 97, weight 103 pounds, height 5 feet 5 inches.  HEENT:  Head is normal.  Ears, nose, and throat within normal limits.  NECK:  Supple, no cervical lymph nodes, no thyromegaly.  CHEST:  Symmetrical.  Lungs are  fully expanded and clear to percussion and auscultation.  HEART:  Regular rhythm, no murmur, no gallops.  ABDOMEN:  Soft, nondistended, nontender.  Liver, spleen, and kidneys not palpable.  No organomegaly.  Bowel sounds normal.  GENITALIA:  Penis is uncircumcised and meatus is normal.  Scrotum is unremarkable.  Testicles, cords and epididymis are within normal limits.  He has no inguinal hernia.  RECTAL:  On rectal examination the sphincter tone is normal.  Prostate is enlarged with a nodule on the left lobe of the prostate at the base.  PSA is 4.54.  EXTREMITIES:  Within normal limits.  IMPRESSION: 1. Left renal cell carcinoma. 2. Elevated prostate specific antigen. 3. Prostatic nodule. 4. Hypertension. 5. Coronary artery disease. Dictated by:   Lindaann Slough, M.D. Attending Physician:  Lindaann Slough DD:  11/23/01 TD:  11/23/01 Job: [id number]

## 2010-11-09 NOTE — Consult Note (Signed)
NAMEAMEDIO, BOWLBY NO.:  [id number]   MEDICAL RECORD NO.:  [id number]          PATIENT TYPE:  INP   LOCATION:  3032                         FACILITY:  [facility]   PHYSICIAN:  Althea Grimmer. Santogade, M.D.DATE OF BIRTH:  [birth year]   DATE OF CONSULTATION:  08/16/2004  DATE OF DISCHARGE:                                   CONSULTATION   Mr. Colavito is a 77 year old male with congenital blindness and deafness whom  I am asked to see for choledocholithiasis.  He presented to the emergency  room yesterday apparently with a few days of abdominal discomfort, nausea  and vomiting with questionable diarrhea.  He had chills and diaphoresis  according to a family friend.  History cannot be obtained from the patient.  An ultrasonogram demonstrated a common bile duct of 11 mm with a stone of  that size within it.  His total bilirubin is 4.5, alkaline phosphatase 117,  SGOT 143, SGPT 288, lipase is normal.   OUTPATIENT MEDICATIONS:  Toprol, Prevacid, hydrochlorothiazide and Lotrel.   ALLERGIES:  ASPIRIN.   PAST MEDICAL HISTORY:  1.  Hypertension.  2.  Congenital blindness and deafness.  3.  Left renal cell carcinoma treated with a partial nephrectomy.  4.  Coronary artery disease status post stent placement.   FAMILY HISTORY:  Noncontributory.   SOCIAL HISTORY:  Former smoker, no alcohol.  Lives with his sister.  The  patient affordably does understand sign language and he feels it with his  hand.   REVIEW OF SYMPTOMS:  Not obtainable.   PHYSICAL EXAMINATION:  GENERAL APPEARANCE:  He is in no acute distress.  VITAL SIGNS:  Afebrile.  Blood pressure 181/93, pulse 104.  HEENT:  Conjunctivae are pink.  Oropharynx unremarkable.  CHEST:  Chest sounds clear.  CARDIOVASCULAR:  Heart sounds are regular rate and rhythm.  ABDOMEN:  Soft with present bowel sounds.  There is mild tenderness in the  epigastrium and right upper quadrant.  There is no mass or rebound or  hernia.  RECTAL:   Exam not performed.  EXTREMITIES:  Without edema.   ADDITIONAL LABORATORY TESTS:  Hemoglobin 14.2, white blood count 18.5,  platelet count normal.  Prothrombin time normal.  Potassium 2.5 being  repleted.   IMPRESSION:  A 77 year old male with jaundice and abnormal liver function  tests and abdominal pain in the face of a documented common bile duct stone.  It is my feeling that due to his elevated white blood count, ERCP should be  performed in the very near future with attempt at removal so that he does  not get severe ascending cholangitis or  further bouts of pain.  I discussed this with his sister over the phone who  was in agreement and is coming to the hospital.  We are going to try and  find someone who can sign to try and explain the procedure to him.  He will  receive ciprofloxacin prior to the ERCP which hopefully can be accomplished  later this afternoon.      PJS/MEDQ  D:  08/16/2004  T:  08/16/2004  Job:  [id number]

## 2010-11-09 NOTE — Cardiovascular Report (Signed)
[place]  Patient:    Alexander Rosales, Alexander Rosales Visit Number: [id number] MRN: [id number]          Service Type: MED Location: [facility] 2899 01 Attending Physician:  Silvestre Mesi Dictated by:   Aram Candela Aleen Campi, M.D. Proc. Date: 09/23/01 Admit Date:  09/23/2001 Discharge Date: 09/24/2001   CC:         Rosanne Sack, M.D.  Renford Dills, M.D.  Cardiac Cath Lab; [facility]  [facility] Records Room   Cardiac Catheterization  REFERRING PHYSICIAN:  Rosanne Sack, M.D. and Renford Dills, M.D.  PROCEDURE DONE BY:  Aram Candela. Aleen Campi, M.D.  PROCEDURES: 1. Left heart catheterization. 2. Coronary cine angiography. 3. Left ventricular cine angiography. 4. Abdominal aortogram. 5. Angioplasty with primary stenting of the ramus branch or intermediate    branch. 6. Perclose of the right femoral artery.  INDICATION FOR PROCEDURES:  This 77 year old male who is deaf and blind was admitted to [facility] after the onset of severe anterior chest pain and severe hypertension.  His risk profile was high for the presence of ischemic heart disease.  There was much difficulty in communicating because of his sensory deprivation and we felt that he understood that heart testing was to be done and intervention if necessary.  DESCRIPTION OF PROCEDURE:  After signing an informed consent by his sister, he was then transported from [facility] to the [facility] cardiac catheterization lab.  His right groin was prepped and draped in a sterile fashion and anesthetized locally with 1% lidocaine.  A 6-French introducer sheath was inserted percutaneously into the right femoral artery. Six-French #4 Judkins coronary catheters were used to make injections into the native coronary arteries.  A 6-French pigtail catheter was used to measure pressures in the left ventricle and aorta and to make mid stream injections into  the left ventricle and abdominal aorta.  After noting a critical stenosis in his large intermediate or ramus branch, we notified his sister of the findings and she felt that he understood the indications for proceeding with the angioplasty.  We then selected a 6-French JL-4 guide catheter which was advanced to the root of the aorta.  After inserting a short high torque floppy guide wire into the guide catheter, the tip was engaged in the left coronary artery and the guide wire was directed into the intermediate branch through the proximal lesion and the tip was positioned distally in the intermediate branch.  We then selected a 2.75 x 8 mm Express 2 monorail stent deployment system which after proper preparation was inserted over the guide wire and positioned within the ramus branch lesion.  After positioning the stent, it was then deployed with one inflation at 16 atmospheres for 36 seconds.  After the stent was deployed, the deployment balloon was removed and injections again into the left coronary artery showed an excellent angiographic result with 0% residual lesion and normal antegrade flow.  There was no evidence for dissection or clot.  The patient tolerated the procedure well and no complications were noted.  At the end of the procedure, the catheter and sheath were removed from the right femoral artery and hemostasis was easily obtained with the Perclose closure system.  MEDICATIONS GIVEN:  Heparin 5000 units IV.  HEMODYNAMIC DATA:  Left ventricular pressure: 185/0-14.  Aortic pressure: 182/88 with a mean of 124.  CINE FINDINGS: 1.  Left ventricular cine angiogram: The left ventricular chamber size appears  normal.  The left ventricular contractility is normal with an ejection     fraction of 50-60%.  The mitral and aortic valves appears normal. 2.  Coronary cine angiography: Left coronary artery: The ostium and left main     appear normal. 3.  Left anterior descending: The  LAD has very minor plaque which is     nonobstructive and does not cause a decrease in flow.  There is normal     runoff. 4.  Circumflex coronary artery: The circumflex has mild plaque with focal     eccentric plaque causing 20-30% stenosis.  There is normal antegrade flow. 5.  Intermediate or ramus branch: The intermediate branch is a large branch     and has a focal eccentric 80% stenosis in its proximal segment.  There     was a mild decrease in antegrade flow during the initial injections. 6.  Right coronary artery: The right coronary artery is an intermediate-sized     artery supplying only part of the posterior descending and a small     posterolateral branch of the left ventricle.  There are mild plaques in     the proximal segment causing approximately 20% stenosis.  There is normal     antegrade flow. 7.  Abdominal aortogram: The abdominal aorta has minor plaque without     obstruction and no evidence of aneurysm.  The renal arteries are     essentially normal and have normal flow. 8.  Angioplasty cines: Cines taken during the angioplasty procedure showed     proper positioning of the guide wire and stent deploying the system.  A     good balloon form was obtained during the stent deployment.  Final     injections into the left coronary artery showed an excellent angiographic     result with 0% residual lesion and no evidence for dissection or clot.     There was normal antegrade flow.  FINAL DIAGNOSES: 1. Single vessel coronary artery disease with critical stenosis of his large    intermediate or ramus branch. 2. Successful angioplasty with primary stenting of the intermediate branch. 3. Minor right coronary artery and circumflex plaque 20% maximum stenosis and    normal antegrade flow. 4. Normal left ventricular function. 5. Normal aortic and mitral valves.  6. Normal abdominal aorta and renal arteries. 7. Successful Perclose of the right femoral  artery.  DISPOSITION: 1. Will admit here to the [facility] for further monitoring for his angioplasty. 2. Will notify Dr. Jamie Brookes and Dr. Nehemiah Settle of this transfer and they will    continue his medical treatment, especially treatment of his severe    hypertension. Dictated by:   Aram Candela. Aleen Campi, M.D. Attending Physician:  Silvestre Mesi DD:  09/24/00 TD:  09/24/01 Job: [id number]

## 2010-11-09 NOTE — Op Note (Signed)
[facility]  Patient:    KEEGAN, DUCEY Visit Number: [id number] MRN: [id number]          Service Type: SUR Location: 1W [id number] Attending Physician:  Lindaann Slough Dictated by:   Lindaann Slough, M.D. Proc. Date: 11/23/01 Admit Date:  11/23/2001   CC:         Sigmund I. Patsi Sears, M.D.  [facility]   Operative Report  PREOPERATIVE DIAGNOSIS:  Left renal cell carcinoma.  POSTOPERATIVE DIAGNOSIS:  Left renal cell carcinoma.  PROCEDURE DONE:  Left partial nephrectomy.  SURGEON:  Lindaann Slough, M.D.  ANESTHESIA:  General.  ASSISTANT:  Sigmund I. Patsi Sears, M.D. and Lara Mulch, P.A.  INDICATION:  The patient is a 77 year old male who was found on CT scan and MRA to have a 3.5 cm mass in the mid to upper pole of the left kidney suggestive of renal cell carcinoma. The patient is scheduled today for partial nephrectomy. The risks, benefits, and alternatives to the procedure have been discussed with the patient through his interpreter and to his sister.  PROCEDURE:  Under general anesthesia, the patient was prepped and draped and placed in the left lateral decubitus position. A transverse incision was made over the twelfth rib. The incision was carried down to the subcutaneous tissues. The external oblique, internal oblique, and transverse muscles were incised. The twelfth rib was then dissected from the surrounding tissues and the tip of the twelfth rib was excised. The dorsolumbar fascia was incised and the retroperitoneum was entered. The kidney was then identified and freed from the surrounding tissues. The perinephric fat was dissected from the kidney and there is a 3 cm mass in the mid portion of the kidney close to the hilum. The renal artery was then identified and secured with a vessel loop. Then the tumor was circumscribed and with blunt dissection with the handle of the knife, the tumor was excised. The specimen  was sent for frozen section. Then the area of resection was fulgurated with the Argon beam coagulator. There was minimal bleeding at that point. Then fibrin glue was placed over the wound and Gelfoam with Surgicel was placed over the area of resection of the tumor. The wound was then irrigated with normal saline. There was no evidence of bleeding at the end of the procedure. The perinephric fat was then secured over the kidney. Then the wound was closed in three layers with 0 PDS. A Marcaine pump was then secured in the wound. Then the skin was closed with 4-0 Monocryl using subcuticular sutures. Estimated blood loss 300 cc. Blood replacement none. Sponge, instrument, and needle counts were correct on two occasions. The patient tolerated the procedure well and left the OR in satisfactory condition to Post Anesthesia Care Unit. Dictated by:   Lindaann Slough, M.D. Attending Physician:  Lindaann Slough DD:  11/23/01 TD:  11/24/01 Job: [id number]

## 2010-11-09 NOTE — Discharge Summary (Signed)
Alexander Rosales, Alexander Rosales NO.:  [id number]   MEDICAL RECORD NO.:  [id number]          PATIENT TYPE:  INP   LOCATION:  3032                         FACILITY:  [facility]   PHYSICIAN:  Duncan Dull, M.D.     DATE OF BIRTH:  03-12-1930   DATE OF ADMISSION:  08/16/2004  DATE OF DISCHARGE:  08/30/2004                                 DISCHARGE SUMMARY   DISCHARGE DIAGNOSES:  1.  Cholelithiasis status post laparoscopic polycystectomy August 22, 2004.  2.  Choledocholithiasis, status post endoscopic retrograde      cholangiopancreatography x2.  3.  Hypertension.  4.  Hyponatremia secondary to syndrome of inappropriate antidiuretic      hormone.  5.  Congenital deafness with secondary mutism.  6.  Blindness.  7.  Status post left heminephrectomy for renal cell cancer.   DISCHARGE MEDICATIONS:  1.  Norvasc 10 mg daily p.o.  2.  Protonix 40 mg daily  3.  Toprol 100 mg, 3 tablets a day.  4.  Tylenol 650 mg p.o. q.6h. p.r.n. for pain.   Do not give Lotrel for now and do not give hydrochlorothiazide for now.  Also he was sent on free water restriction of 600 cc a day.   PROCEDURES:  He had an ERCP on August 16, 2004, laparoscopic  cholecystectomy August 22, 2004, ERCP on August 25, 2004.  Abdominal ultrasound  August 16, 2004.   IMPRESSION:  1.  Eleven millimeter common duct stone with upper intrahepatic biliary duct      dilatation.  No stones identified in the gallbladder lumen and there is      no evidence for gallbladder wall thickening.  2.  A 2D echo on August 20, 2004 summary technically difficult study.      Left ventricular ejection fraction was estimated, range being 55-65%.  3.  Intraoperative cholangiogram August 22, 2004 and chest x-ray March 5th and      6th.   CONSULTS/SURGERY:  Dr. Viviann Spare, Dr. Carolynne Edouard and gastroenterology, Dr.  Luther Parody.   DISPOSITION:  The patient is to go home on fluid restriction with an  appointment at the outpatient clinic March 14th  at 1:30 p.m. when CBC is  going to be drawn to check white blood cell count and also BMET is going to  be drawn to check sodium on serum sodium.   HISTORY OF PRESENT ILLNESS:  Alexander Rosales is a 77 year old African-American  male with past medical history significant for uncontrolled hypertension,  coronary artery disease, left renal cell cancer status post left partial  nephrectomy, congenital blindness and deafness, who presented to the  emergency room with abdominal pain for about two days prior to admission.  Per patient's sister, the patient started having abdominal pain two days  prior to admission.  Ten hours prior to admission, after patient ate dinner  and came out shortly with hands over mouth and went to bathroom when he had  one episode of vomiting.  Vomiting brownish and moaning with abdominal pain.  Positive nausea, no fever, positive sweating after vomiting.  No chest pain.  No  shortness of breath.  No palpitation.  No urinary or bowel symptoms.  In  ED, the patient given Zofran and had one episode of vomiting.  Foley was  attempted x 3 in ED but unsuccessful.  The patient with hematuria in ED per  sister and none at home.  At exam, pulse was 99, blood pressure 170/91,  temperature 97, respiratory rate 20, oxygen saturation 97% on room air.  Abdomen was soft, some tender to palpation in epigastric region and right  upper quadrant, mild tightness.  Some tenderness to palpation in suprapubic  area, some guarding.  No rebound.   LABORATORY DATA:  On admission, sodium 146, potassium 2.5, chloride 97,  bicarb 32, BUN 16, creatinine 1.4, glucose 169.  White blood cell count  18.5, hemoglobin 14.2, hematocrit 41.1, platelets 187, MCV 87.3.  anion gap,  bilirubin 4.5, alkaline phosphatase 117, AST 143, ALT 288, protein 6.6,  albumin 3.3, calcium 8.5.  Right upper quadrant ultrasound already  mentioned.   HOSPITAL COURSE:  Problem 1. Cholelithiasis. When the patient was admitted   after the ultrasound it was found that he had cholelithiasis.  He underwent  ERCP on August 14, 2004 that was successful and they were able to take all  three stones in that opportunity with no complications.  During this  procedure, it was noted that the patient had some stones on the gallbladder  so for that reason surgery was consulted and after medical clearance, the  patient had laparoscopic cholecystectomy.  The patient had good response  after the surgery and he was signed off for surgery once the patient was  absolutely asymptomatic.  No complications were seen after the procedure.  During the surgery, it was noted that there was another stone on the common  bile duct so, for that reason, GI was consulted again.   Problem 2. Choledocholithiasis.  Initially, the patient was admitted with  this diagnosis.  ERCP was done and was successful but after the surgery,  because a new stone was seen on the common bile duct, GI was consulted  again, who decided to do a new ERCP.  This time, they were able to retrieve  one stone that was taken out and, after the stone was removed, cholangiogram  during the procedure did not show any stone remaining.  The patient had a  very good response to the second surgery and did not present any other  complication.  No fever and liver function tests came back to normal limits.   Problem 3. Hypertension. The patient was put initially on Norvasc plus  Toprol XL and Lotrel.  The patient did not have any problem with her blood  pressure but, due to the fact that the patient presented hyponatremia,  during the hospitalization Lotrel was held and it is going to be restarted  as an outpatient.  While out of Lotrel, the patient remained slightly  hypertensive but this is going to followed up as an outpatient.   Problem 4. Hyponatremia.  The patient, when admitted, was within normal limits according to his serum sodium.  After the surgery and two ERCPs, the   patient became hyponatremic.  Plasma osmolality and urine osmolality were  done.  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  The patient slightly improved and was stable before discharge, even  though a little bit hyponatremic, he was  sent home with water restriction  and this is going to be followed up as an outpatient with a BMET and, if  sodium is within normal limits, Lotrel is going to be restarted again.   Finally, the patient was sent home with instructions and this was done  through interpreter for him and also because, during this hospitalization,  the patient presented some elevated leukocytosis due to the  choledocholithiasis and all  the procedures, this is going to be followed up as an outpatient.  White  blood cell count what with tendency to normalization before he leave, for  that reason we thought it was safe to send him home and no antibiotics as he  did not present fever for the last week of hospitalization.      YC/MEDQ  D:  08/31/2004  T:  09/01/2004  Job:  [id number]

## 2010-11-09 NOTE — Discharge Summary (Signed)
. [facility]  Patient:    Alexander Rosales, RABINE Visit Number: [id number] MRN: [id number]          Service Type: MED Location: [id number] Attending Physician:  Silvestre Mesi Dictated by:   Rosanne Sack, M.D. Admit Date:  09/23/2001 Discharge Date: 09/24/2001   CC:         Jonny Ruiz R. Aleen Campi, M.D.  Gertha Calkin, M.D.   Discharge Summary  DATE OF BIRTH:  July 31, 1930  DISCHARGE DIAGNOSES: 1. Unstable angina secondary to single-vessel coronary artery disease.    a. Status post percutaneous transluminal coronary angioplasty and stent       placement at a large ramus branch without complications.    b. Negative cardiac enzymes.    c. Normal left ventricular function. 2. Hypertensive crisis, resolved. 3. Deafness since birth. 4. Left ventricular hypertrophy by electrocardiogram criteria. 5. Blindness for about 20 years due to unknown cause (glaucoma versus macular    degeneration).  DISCHARGE MEDICATIONS: 1. Baby aspirin, enteric coated, 1 tablet p.o. q.d. 2. Plavix 75 mg p.o. q.d. x3 weeks. 3. Lotensin 20 mg p.o. q.d. 4. Toprol XL 50 mg p.o. q.d. 5. Hydrochlorothiazide 25 mg p.o. q.d. 6. Prevacid 30 mg p.o. q.d.  DISCHARGE FOLLOWUP AND DISPOSITION:  Alexander Rosales will be followed at the outpatient clinic at [facility] on October 08, 2001 in the [facility] for hospital followup.  During this followup visit, we recommend to recheck another BMET to follow on the patients recent transient hypokalemia.  Also, we recommend to follow the patients blood pressure and adjust the antihypertensive agents as needed.  Finally, we recommend to evaluate if the patient has had any recurrent chest symptoms.  On Nov 02, 2001, the patient will be followed in the outpatient clinic by Dr. Delilah Shan, who will become a primary care Masato Pettie for this patient.  Dr. Aleen Campi, [facility], will see [name] from the cardiac standpoint  within 2-3 weeks.  CONSULTATIONS:  Dr. Aleen Campi, cardiology.  PROCEDURES:  Cardiac catheterization with percutaneous transluminal coronary angioplasty and stent, as described in the problem list.  DISCHARGE LABORATORY DATA:  Cardiac enzymes negative x3.  Urinalysis significant for protein 30.  Hemoglobin 15.1, MCV 85, WBC 7.3, platelets 200. PTT 34, PT 13.0.  Sodium 136, potassium 3.5, chloride 102, CO2 27, BUN 14, creatinine 1.1, glucose 129.  Magnesium 2.2.  HOSPITAL COURSE:  Alexander Rosales is a very pleasant 77 year old man admitted initially to [facility] on September 22, 2001 with hypertensive crisis and recurrent chest pain.  Please see admission H&P by Dr. Jamie Brookes for further details regarding the history of presentation, physical exam, and lab data.  #1 - UNSTABLE ANGINA SECONDARY TO SINGLE-VESSEL CORONARY ARTERY DISEASE:  The patient, through an interpreter, described chest discomfort worrisome for unstable angina.  For this reason, the patient was placed on a nitroglycerin drip, Lovenox, aspirin, and Plavix along with a beta-blocker.  A cardiology consult was obtained for further input.  Dr. Aleen Campi, cardiologist, recommended a cardiac catheterization.  This procedure was performed on September 23, 2001.  A single-vessel lesion affecting a large ramus branch was found.  This lesion was about 80%.  The rest of the coronaries were normal and the left ventricular function was normal.  A PTCA and stent placement was performed of this lesion without complications.  Post cardiac catheterization, the patient did not have any complications.  His chest pain did not recur when he was ambulated in the unit.  The patient was discharged from [facility], where the cardiac catheterization was performed.  Dr. Aleen Campi recommended to keep Plavix for three weeks.  The patient also will remain on aspirin and a beta-blocker for heart protection.  Instructions for low-salt/low-fat diet were given  to the patients sister, who is the main caregiver.  The patient will be followed by Dr. Aleen Campi from the cardiac standpoint and by Dr. Delilah Shan at the outpatient clinic, internal medicine residency program, at [facility] from the general medicine standpoint.  The cardiac enzymes were negative.  The patient was discharged home in stable condition.  #2 - HYPERTENSIVE CRISIS:  The initial blood pressure in the emergency department was 240/137.  This was in the context of chest pressure.  The patient was treated with multiple antihypertensive agents on admission with good results.  The renal function was stable.  The urinalysis had protein of 30.  On day #2, the patients blood pressure improved and the drips were weaned down without complications.  At the time of discharge, the patients blood pressure was 125-140/60-70.  Further followup on this problem will be provided by the new primary care Emmalea Treanor in the outpatient clinic, as described above.  The patient had a mild transient hypokalemia that was corrected with potassium supplementation.  I spent less than 30 minutes in the discharge process of this patient.  MEDICAL CONDITION AT THE TIME OF DISCHARGE:  Improved. Dictated by:   Rosanne Sack, M.D. Attending Physician:  Silvestre Mesi DD:  09/24/00 TD:  09/25/01 Job: [id number]

## 2010-11-09 NOTE — Op Note (Signed)
NAMEDAXSON, REFFETT NO.:  [id number]   MEDICAL RECORD NO.:  [id number]          PATIENT TYPE:  INP   LOCATION:  3032                         FACILITY:  [facility]   PHYSICIAN:  Ollen Gross. Vernell Morgans, M.D. DATE OF BIRTH:  11-21-1929   DATE OF PROCEDURE:  08/22/2004  DATE OF DISCHARGE:                                 OPERATIVE REPORT   PREOPERATIVE DIAGNOSIS:  Gallstones.   POSTOPERATIVE DIAGNOSIS:  Gallstones with common duct stones.   PROCEDURE:  Laparoscopic cholecystectomy with intraoperative cholangiogram.   SURGEON:  Ollen Gross. Carolynne Edouard, M.D.   ASSISTANT:  Anselm Pancoast. Zachery Dakins, M.D.   ANESTHESIA:  General endotracheal.   PROCEDURE:  After informed consent was obtained, the patient was brought to  the operating room and placed in the supine position on the operating room  table.  After adequate induction of general anesthesia, the patient's  abdomen was prepped with Betadine and draped in the usual sterile manner.  The area below the umbilicus was infiltrated with 0.25% Marcaine and a small  incision was made with a 15 blade knife.  This incision was carried down  through the subcutaneous tissue bluntly with a Kelly clamp and [employer]  retractors until the linea alba was identified.  The linea alba was incised  with a 15 blade knife and each side was grasped with Kocher clamps and  elevated anteriorly.  The preperitoneal space was then probed bluntly with a  hemostat until the peritoneum was opened and access was gained to the  abdominal cavity.  A 0 Vicryl pursestring stitch was placed in the fascia  surrounding the opening and a Hasson cannula placed through the opening and  anchored in place with the previous placed Vicryl pursestring stitch.  The  abdomen was then insufflated with carbon dioxide without difficulty.  The  patient was placed in the head-up position and rotated slightly with the  right side up.  The laparoscope was inserted through the Hasson cannula  and  the right upper quadrant was inspected.  The dome of the gallbladder and  liver were readily identified.  Next the epigastric region was infiltrated  with 0.25% Marcaine.  A small incision was made with a 15 blade knife and a  10 mm port was placed bluntly through this incision into the abdominal  cavity under direct vision.  Sites were then chosen laterally on the right  side of the abdomen for 5 mm ports.  Each of these areas was infiltrated  with 0.25% Marcaine.  Small stab incisions were made with a 15 blade knife  and 5 mm ports were placed bluntly through these incisions into the  abdominal cavity under direct vision.  A blunt grasper was placed through  the lateralmost 5 mm port and used to grasp the dome of the gallbladder and  elevate it anteriorly and superiorly, and another blunt grasper was placed  through the other 5 mm port and used to retract on the body and neck of the  gallbladder.  The gallbladder was very inflamed and difficult to grasp.  An  aspirating device  was placed through the epigastric port and used to  puncture the dome of the gallbladder and aspirate it.  This allowed better  grasping and retraction on the gallbladder.  The dissector was then placed  through the epigastric port and using the electrocautery, the peritoneal  reflection at the gallbladder neck was opened.  Blunt dissection was then  carried out in this area until the gallbladder neck-cystic duct junction was  readily identified and a good window was created.  A single clip was placed  on the gallbladder neck.  A small ductotomy was made just below the clip  with the laparoscopic scissors.  A 14-gauge Angiocath was then placed  percutaneously through the anterior abdominal wall under direct vision.  A  Reddick cholangiogram catheter was placed through the Angiocath and flushed.  The Reddick catheter was then placed within the cystic duct and anchored in  place with a clip.  A cholangiogram was  obtained that showed several small  common duct stones, good emptying into the duodenum, and adequate length on  the cystic duct.  The cystic duct was actually spiraled and although we  could obtain a cholangiogram, we could not feed the Reddick catheter any  further down to the common duct.  The anchoring clip and catheters were then  removed from the patient.  Three clips were placed proximally on the cystic  duct, and the duct was divided between the two sets of clips.  Posterior to  this the cystic artery was identified and again dissected bluntly in a  circumferential manner until a good window as placed.  Two clips were placed  proximally and one distally on the artery and the artery divided between the  two sets of clips.  Next the laparoscopic hook cautery device was used to  separate the gallbladder from the liver bed.  Prior to completely detaching  the gallbladder from the liver bed, the liver bed was inspected and several  small bleeding points were coagulated with the electrocautery until the area  was completely hemostatic.  The gallbladder was then detached the rest of  the way from the liver bed without difficulty.  A laparoscopic bag was  placed through the epigastric port and the gallbladder was placed within the  bag, and the bag was sealed.  The abdomen was then irrigated with copious  amounts of saline until the effluent was clear.  The laparoscope was then  moved to the epigastric port and the gallbladder grasper was placed through  the Hasson cannula and used to grasp the opening in the bag.  The bag with  the gallbladder was removed with the Hasson cannula through the  infraumbilical port without difficulty.  The fascial defect was closed with  the previous-placed Vicryl pursestring stitch as well as with another  interrupted 0 Vicryl stitch.  The rest of the ports were removed under  direct vision and were all found to be hemostatic.  Gas was allowed to escape.  The  skin incisions were closed with interrupted 4-0 Monocryl  subcuticular stitches, Benzoin and Steri-Strips were applied.  The patient  tolerated the procedure well.  At the end of the case, all needle, sponge  and instrument counts were correct.  The patient was then awakened and taken  to the recovery room in stable condition.  Gastroenterology will be  reconsulted for the common duct stones.      PST/MEDQ  D:  08/27/2004  T:  08/27/2004  Job:  [id number]

## 2010-11-09 NOTE — Consult Note (Signed)
NAMEROBEL, WUERTZ NO.:  [id number]   MEDICAL RECORD NO.:  [id number]          PATIENT TYPE:  INP   LOCATION:  3032                         FACILITY:  [facility]   PHYSICIAN:  Gabrielle Dare. Janee Morn, M.D.DATE OF BIRTH:  07/09/1929   DATE OF CONSULTATION:  08/17/2004  DATE OF DISCHARGE:                                   CONSULTATION   REFERRING PHYSICIAN:  Althea Grimmer. Luther Parody, M.D.   REASON FOR CONSULTATION:  Gallstones with history of choledocholithiasis.   HISTORY OF PRESENT ILLNESS:  The patient is a 77 year old African-American  male, who is admitted two days ago with some abdominal pain, nausea and  vomiting.  She has a history of congenital blindness and deafness.  Dr.  Luther Parody asked [place] to evaluate him.  He has undergone ERCP, which showed  common bile duct stone and dilatation; along with other workup which had  revealed elevated liver function tests, including AST 143, ALT 285, total  bilirubin 4.5, and alkaline phosphatase 117.  The patient was noted to have  a residual gallbladder stone, after a successful ERCP.  We are asked to see  him in regards to a possible cholecystectomy.   The patient is unable to give history due to his congenital blindness and  deafness.  He apparently is able to communicate via sign language with his  sister, but she is not present at this time.  His aunt is present and does  offer some limited history.   PAST MEDICAL HISTORY:  1.  Hypertension.  2.  Congenital blindness and deafness.  3.  Left renal cell carcinoma, requiring partial nephrectomy.  4.  Coronary artery disease with history of stent.   PAST SURGICAL HISTORY:  Partial left nephrectomy.   SOCIAL HISTORY:  He lives in [place].  He does not smoke anymore, and  does not drink alcohol.   MEDICATIONS:  1.  Protonix 40 mg IV q.d.  2.  Toprol XL 300 mg p.o. q.d.  3.  Cipro 400 mg IV q.12h.   ALLERGIES:  ASPIRIN.   FAMILY HISTORY AND REVIEW OF SYSTEMS:   Unobtainable, due to his condition.   PHYSICAL EXAMINATION:  VITAL SIGNS:  Temperature 98.1, pulse 90,  respirations 18, blood pressure 173/89.  GENERAL:  He is awake.  He nods and smiles when I shake his hand.  HEENT:  Sclerae have some icterus, and his oropharynx is unremarkable.  NECK:  Supple.  LUNGS:  Clear to auscultation.  HEART:  Regular rate and rhythm.  PMI is palpable at left chest.  ABDOMEN:  Soft, without tenderness.  On my examination at this time it is  mildly distended.  EXTREMITIES:  No significant edema.  SKIN:  Warm and dry.   DATA REVIEW:  Ultrasound showing common bile duct stone on August 16, 2004.  Liver function tests, as listed above.   IMPRESSION:  Choledocholithiasis, status post ERCP with sphincterotomy.  The  patient also has cholelithiasis.  The patient is currently nontender on my  examination.  We will plan cholecystectomy prior to the patient's discharge.  I agree with  continuing his IV antibiotics and we will work on scheduling  and also speaking with his sister and through his sister to him to really  explain the situation, the procedure and the risks and benefits of surgery.   Thank you very much for this consultation.      BET/MEDQ  D:  08/17/2004  T:  08/19/2004  Job:  [id number]

## 2011-08-19 ENCOUNTER — Ambulatory Visit (INDEPENDENT_AMBULATORY_CARE_PROVIDER_SITE_OTHER): Payer: PRIVATE HEALTH INSURANCE | Admitting: Internal Medicine

## 2011-08-19 ENCOUNTER — Encounter: Payer: Self-pay | Admitting: Internal Medicine

## 2011-08-19 VITALS — BP 163/85 | HR 90 | Temp 97.7°F | Ht 60.25 in | Wt 116.7 lb

## 2011-08-19 DIAGNOSIS — R350 Frequency of micturition: Secondary | ICD-10-CM

## 2011-08-19 DIAGNOSIS — Z23 Encounter for immunization: Secondary | ICD-10-CM

## 2011-08-19 DIAGNOSIS — H543 Unqualified visual loss, both eyes: Secondary | ICD-10-CM

## 2011-08-19 DIAGNOSIS — K219 Gastro-esophageal reflux disease without esophagitis: Secondary | ICD-10-CM

## 2011-08-19 DIAGNOSIS — I10 Essential (primary) hypertension: Secondary | ICD-10-CM

## 2011-08-19 DIAGNOSIS — E785 Hyperlipidemia, unspecified: Secondary | ICD-10-CM

## 2011-08-19 DIAGNOSIS — E876 Hypokalemia: Secondary | ICD-10-CM

## 2011-08-19 DIAGNOSIS — E7800 Pure hypercholesterolemia, unspecified: Secondary | ICD-10-CM

## 2011-08-19 DIAGNOSIS — M549 Dorsalgia, unspecified: Secondary | ICD-10-CM

## 2011-08-19 DIAGNOSIS — H913 Deaf nonspeaking, not elsewhere classified: Secondary | ICD-10-CM

## 2011-08-19 DIAGNOSIS — N400 Enlarged prostate without lower urinary tract symptoms: Secondary | ICD-10-CM

## 2011-08-19 MED ORDER — VITAMIN D3 25 MCG (1000 UT) PO CAPS: 1.0000 | ORAL_CAPSULE | Freq: Every morning | ORAL | Status: DC

## 2011-08-19 MED ORDER — TAMSULOSIN HCL 0.4 MG PO CAPS: 0.4000 mg | ORAL_CAPSULE | Freq: Every day | ORAL | Status: DC

## 2011-08-19 MED ORDER — PANTOPRAZOLE SODIUM 40 MG PO TBEC: 40.0000 mg | DELAYED_RELEASE_TABLET | Freq: Every day | ORAL | Status: DC

## 2011-08-19 MED ORDER — METOPROLOL SUCCINATE ER 100 MG PO TB24: 100.0000 mg | ORAL_TABLET | Freq: Every day | ORAL | Status: DC

## 2011-08-19 MED ORDER — AMLODIPINE BESY-BENAZEPRIL HCL 10-20 MG PO CAPS: 1.0000 | ORAL_CAPSULE | Freq: Every day | ORAL | Status: DC

## 2011-08-19 MED ORDER — HYDRALAZINE HCL 25 MG PO TABS: 25.0000 mg | ORAL_TABLET | Freq: Two times a day (BID) | ORAL | Status: DC

## 2011-08-19 MED ORDER — DICLOFENAC SODIUM 1 % TD GEL: 1.0000 "application " | TRANSDERMAL | Status: DC | PRN

## 2011-08-19 NOTE — Progress Notes (Signed)
  Subjective:    Patient ID: Alexander Rosales, male    DOB: 1930/05/25, 78 y.o.   MRN: [id number]  HPI: 78 year old man with past medical history significant for deafness and blindness, hypertension comes to the clinic almost after 3 years to reestablish his care. He was  accompanied by his sister.  He was staying at [facility] assisted living facility , that closed about a month ago and since then he has been living with his sister-in-law. Earlier he used to live with his sister who accompanied him with this visit but now she herself is too old to take care of him.  As the patient is deaf and blind history, interpreter was present but still it was very difficult to obtain a full history from sign language. Most of the history was obtained  by calling the sister-in-law.  As of today he reports having generalized backache and right leg pain but could not specify if the back pain was radiating to the leg. On phone , his sister-in-law mentioned that he has been having frequency of urination but denies any hematuria or hesitancy. Patient reports that he drinks a lot of water and therefore goes to the bathroom more often.    Review of Systems  Constitutional: Negative for fatigue.  HENT: Negative for congestion, rhinorrhea and postnasal drip.   Eyes: Negative for visual disturbance.  Respiratory: Negative for cough, choking and chest tightness.   Cardiovascular: Negative for chest pain, palpitations and leg swelling.  Genitourinary: Negative for dysuria.  Musculoskeletal: Positive for back pain.  Neurological: Negative for dizziness, facial asymmetry, light-headedness, numbness and headaches.       Objective:   Physical Exam  Constitutional: He is oriented to person, place, and time. He appears well-developed and well-nourished. No distress.  HENT:  Head: Normocephalic and atraumatic.  Mouth/Throat: No oropharyngeal exudate.  Eyes: Conjunctivae and EOM are normal. Pupils are equal, round, and  reactive to light. No scleral icterus.  Neck: Normal range of motion. Neck supple. No JVD present. No tracheal deviation present. No thyromegaly present.  Cardiovascular: Normal rate, regular rhythm, normal heart sounds and intact distal pulses.  Exam reveals no gallop and no friction rub.   No murmur heard. Pulmonary/Chest: Effort normal and breath sounds normal. No stridor. No respiratory distress. He has no wheezes. He has no rales. He exhibits no tenderness.  Abdominal: Soft. Bowel sounds are normal. He exhibits no distension. There is no tenderness. There is no rebound and no guarding.  Musculoskeletal: Normal range of motion. He exhibits no edema and no tenderness.       No paraspinal tenderness, a scar from nephrectomy  noticed on the left lumbar region, SLRT negative.  Lymphadenopathy:    He has no cervical adenopathy.  Neurological: He is alert and oriented to person, place, and time.  Skin: Skin is warm. He is not diaphoretic.          Assessment & Plan:

## 2011-08-19 NOTE — Patient Instructions (Signed)
Please schedule a follow up appointment in 6 months or sooner if needed . Please bring your medication bottles with your next appointment. Please take your medicines as prescribed. I will call you with your lab results if anything will be abnormal. 

## 2011-08-20 DIAGNOSIS — M549 Dorsalgia, unspecified: Secondary | ICD-10-CM | POA: Insufficient documentation

## 2011-08-20 DIAGNOSIS — E876 Hypokalemia: Secondary | ICD-10-CM | POA: Insufficient documentation

## 2011-08-20 DIAGNOSIS — R350 Frequency of micturition: Secondary | ICD-10-CM | POA: Insufficient documentation

## 2011-08-20 LAB — LIPID PANEL
LDL Cholesterol: 17 mg/dL (ref 0–99)
Triglycerides: 371 mg/dL — ABNORMAL HIGH (ref <150)

## 2011-08-20 LAB — COMPLETE METABOLIC PANEL WITH GFR
ALT: 13 U/L (ref 0–53)
BUN: 16 mg/dL (ref 6–23)
CO2: 25 mEq/L (ref 19–32)
Calcium: 8.9 mg/dL (ref 8.4–10.5)
Chloride: 105 mEq/L (ref 96–112)
Creat: 1.11 mg/dL (ref 0.50–1.35)
GFR, Est African American: 72 mL/min
GFR, Est Non African American: 62 mL/min
Glucose, Bld: 114 mg/dL — ABNORMAL HIGH (ref 70–99)
Total Bilirubin: 1 mg/dL (ref 0.3–1.2)

## 2011-08-20 LAB — CBC WITH DIFFERENTIAL/PLATELET
Eosinophils Absolute: 0.1 10*3/uL (ref 0.0–0.7)
Eosinophils Relative: 1 % (ref 0–5)
HCT: 41.2 % (ref 39.0–52.0)
Lymphocytes Relative: 24 % (ref 12–46)
Lymphs Abs: 2 10*3/uL (ref 0.7–4.0)
MCH: 29.8 pg (ref 26.0–34.0)
MCV: 86.6 fL (ref 78.0–100.0)
Monocytes Absolute: 0.8 10*3/uL (ref 0.1–1.0)
Monocytes Relative: 9 % (ref 3–12)
RBC: 4.76 MIL/uL (ref 4.22–5.81)
WBC: 8.3 10*3/uL (ref 4.0–10.5)

## 2011-08-20 LAB — MAGNESIUM: Magnesium: 2.1 mg/dL (ref 1.5–2.5)

## 2011-08-20 MED ORDER — POTASSIUM CHLORIDE 20 MEQ PO PACK: 40.0000 meq | PACK | Freq: Every day | ORAL | Status: DC

## 2011-08-20 NOTE — Assessment & Plan Note (Signed)
Etiology unclear( not on diuretics nor have GI losses). Could be decreased dietary intake versus hypomagnesemia  versus secondary hyperaldosteronism( from renovascular disease ,given HTN).   - Check Mg levels - Replete. - Recheck with next visit.

## 2011-08-20 NOTE — Assessment & Plan Note (Signed)
Check lipid panel and CMET. 

## 2011-08-20 NOTE — Assessment & Plan Note (Addendum)
Patient and his sister-in-law reports increased frequency of urination. But clearly denies any dysuria, hematuria, hesitancy or urgency. Given his blindness and deafness the history is unreliable. -Give him a trial of Flomax to  see if his symptoms improve. - Will try to get his records from his previous PCP. - Check UA

## 2011-08-20 NOTE — Assessment & Plan Note (Signed)
He reports generalized low backache. On exam he does not have any paraspinal tenderness, histologically was negative. -Symptomatic treatment with Voltaren gel.

## 2011-08-20 NOTE — Assessment & Plan Note (Signed)
Lab Results  Component Value Date   NA 142 08/19/2011   K 3.0* 08/19/2011   CL 105 08/19/2011   CO2 25 08/19/2011   BUN 16 08/19/2011   CREATININE 1.11 08/19/2011   CREATININE 1.01 04/06/2009    BP Readings from Last 3 Encounters:  08/19/11 163/85  04/06/09 168/78  11/09/07 186/100    Assessment: Hypertension control:  mildly elevated  Progress toward goals:  unchanged Barriers to meeting goals:  no barriers identified  Plan: Hypertension treatment:  Decrease the dose of Toprol - Xl from 200 to 100mg . Start flomax with some urinary complaints.                                         Check BMET today.                                         Follow up in 1 month for BP recheck.

## 2011-08-26 NOTE — Progress Notes (Signed)
Addended by: Filomena Jungling C on: 08/26/2011 04:56 PM   Modules accepted: Orders

## 2011-09-16 ENCOUNTER — Other Ambulatory Visit: Payer: Self-pay | Admitting: Internal Medicine

## 2011-09-16 ENCOUNTER — Ambulatory Visit (INDEPENDENT_AMBULATORY_CARE_PROVIDER_SITE_OTHER): Payer: PRIVATE HEALTH INSURANCE | Admitting: Internal Medicine

## 2011-09-16 VITALS — BP 154/92 | HR 90 | Temp 97.8°F | Ht 60.0 in | Wt 111.8 lb

## 2011-09-16 DIAGNOSIS — E876 Hypokalemia: Secondary | ICD-10-CM

## 2011-09-16 DIAGNOSIS — I10 Essential (primary) hypertension: Secondary | ICD-10-CM

## 2011-09-16 DIAGNOSIS — R350 Frequency of micturition: Secondary | ICD-10-CM

## 2011-09-16 DIAGNOSIS — N400 Enlarged prostate without lower urinary tract symptoms: Secondary | ICD-10-CM

## 2011-09-16 DIAGNOSIS — N179 Acute kidney failure, unspecified: Secondary | ICD-10-CM

## 2011-09-16 LAB — BASIC METABOLIC PANEL WITH GFR
Calcium: 9.6 mg/dL (ref 8.4–10.5)
Creat: 1.43 mg/dL — ABNORMAL HIGH (ref 0.50–1.35)
GFR, Est African American: 53 mL/min — ABNORMAL LOW
GFR, Est Non African American: 46 mL/min — ABNORMAL LOW

## 2011-09-16 LAB — URINALYSIS, ROUTINE W REFLEX MICROSCOPIC
Hgb urine dipstick: NEGATIVE
Leukocytes, UA: NEGATIVE
Nitrite: NEGATIVE
Protein, ur: 100 mg/dL — AB
Urobilinogen, UA: 0.2 mg/dL (ref 0.0–1.0)
pH: 6 (ref 5.0–8.0)

## 2011-09-16 LAB — URINALYSIS, MICROSCOPIC ONLY

## 2011-09-16 MED ORDER — TAMSULOSIN HCL 0.4 MG PO CAPS: 0.4000 mg | ORAL_CAPSULE | Freq: Every day | ORAL | Status: DC

## 2011-09-16 NOTE — Progress Notes (Signed)
  Subjective:    Patient ID: Alexander Rosales, male    DOB: 1930-05-16, 78 y.o.   MRN: [id number]  HPI: 78 year old man with past medical history significant for blindness and deafness, hypertension, CAD comes to the clinic for a followup visit.  He is currently living with his sister-in-law for last 2 months who takes care of him. He could not communicate and was accompanied by an interpreter.  1 Frequency of urination: .Patient was seen in the clinic about one month ago for complaints of frequency of urination but prostrate exam wasn't done and he was empirically started on Flomax for his symptoms. His sister-in-law states that he has slightly decreased frequency of urination. Patient was asked if he had any symptoms of dysuria, urgency but he clearly denies those.  2 Leg pain:  He was just complaining of some mild leg pain at today's visit but denies any falls. His sister- in law also denies any falls.  3. Hypokalemia: Sister - in law got the medication bottles but that did not have potassium pills. I called patient and sent  Scripts to the pharmacy with last visit but she did not understand my message on the phone. She states that she has been giving bananas but not potassium pills.She states that the pharmacy did not give it to her.    Review of Systems  Constitutional: Negative for fever and fatigue.  HENT: Negative for ear pain, rhinorrhea and postnasal drip.   Eyes: Positive for visual disturbance.  Respiratory: Negative for apnea, cough, choking, chest tightness and shortness of breath.   Cardiovascular: Negative for chest pain, palpitations and leg swelling.  Genitourinary: Positive for frequency.  Musculoskeletal: Negative for arthralgias.  Neurological: Negative for dizziness, facial asymmetry, light-headedness and headaches.  Hematological: Negative for adenopathy.       Objective:   Physical Exam  Constitutional: He is oriented to person, place, and time. He appears  well-developed and well-nourished. No distress.  HENT:  Head: Normocephalic and atraumatic.  Mouth/Throat: No oropharyngeal exudate.  Eyes: EOM are normal. Pupils are equal, round, and reactive to light.  Neck: Normal range of motion. Neck supple. No JVD present. No tracheal deviation present. No thyromegaly present.  Cardiovascular: Normal rate, normal heart sounds and intact distal pulses.  Exam reveals no gallop and no friction rub.   No murmur heard. Pulmonary/Chest: Effort normal and breath sounds normal. No stridor. No respiratory distress. He has no wheezes. He has no rales. He exhibits no tenderness.  Abdominal: Soft. Bowel sounds are normal. He exhibits no distension. There is no tenderness. There is no rebound and no guarding.  Genitourinary:       Prostate enlargement was noted on the exam  Musculoskeletal: Normal range of motion. He exhibits no edema and no tenderness.  Lymphadenopathy:    He has no cervical adenopathy.  Neurological: He is alert and oriented to person, place, and time. No cranial nerve deficit. Coordination normal.  Skin: Skin is warm. He is not diaphoretic.          Assessment & Plan:

## 2011-09-16 NOTE — Progress Notes (Deleted)
  Subjective:    Patient ID: Alexander Rosales, male    DOB: [birth year], 78 y.o.   MRN: [id number]  HPI    Review of Systems     Objective:   Physical Exam        Assessment & Plan:

## 2011-09-16 NOTE — Patient Instructions (Signed)
Please schedule a follow up appointment in 3 months or sooner. Please bring your medication bottles with your next appointment. Please take your medicines as prescribed. I will call you with your lab results if anything will be abnormal. 

## 2011-09-19 ENCOUNTER — Telehealth: Payer: Self-pay | Admitting: Internal Medicine

## 2011-09-19 DIAGNOSIS — N179 Acute kidney failure, unspecified: Secondary | ICD-10-CM | POA: Insufficient documentation

## 2011-09-19 MED ORDER — POTASSIUM CHLORIDE 20 MEQ PO PACK: PACK | ORAL | Status: DC

## 2011-09-19 NOTE — Telephone Encounter (Signed)
I called to discuss his labs. Sister- in law was notified of  1)hypokalemia : the scripts that were electronically sent to the pharmacy . 2)acute renal failure- increase in Cr from 1.0> 1.4.  She was requested to get the patient for lab draw( BMET) in 1-2 weeks .  Thank you, Tam Savoia

## 2011-09-19 NOTE — Assessment & Plan Note (Signed)
Likely secondary to diuretics. His potassium continues to be low. His sister in law did not pick up the scripts from the pharmacy. She states that she has been giving him bananas but she did not know if she had to pick up the scripts. - Recheck today. - Replete

## 2011-09-19 NOTE — Assessment & Plan Note (Signed)
He was noted to have acute rise in creatinine( 1.01>1.43). Differentials include pre- renal ( dehydration) vs renal vs post renal. His BUN was not that elevated making dehydration less likely, no nephrotoxic agents were noted on his medication list that makes ATN less likely . Post renal could be possible from prostate enlargement(?). - Recheck BMET in 1 week. - Will call patient's sister -in law to let them know about the appointment for lab draw in 1 week.

## 2011-09-19 NOTE — Assessment & Plan Note (Signed)
His sister in law states that he is going to the bathroom less often. On exam, he was found to have mild prostate enlargement. - Continue Flomax.

## 2011-09-19 NOTE — Assessment & Plan Note (Signed)
Lab Results  Component Value Date   NA 141 09/16/2011   K 3.0* 09/16/2011   CL 103 09/16/2011   CO2 23 09/16/2011   BUN 15 09/16/2011   CREATININE 1.43* 09/16/2011   CREATININE 1.01 04/06/2009    BP Readings from Last 3 Encounters:  09/16/11 154/92  08/19/11 163/85  04/06/09 168/78    Assessment: Hypertension control:  mildly elevated  Progress toward goals:  improved Barriers to meeting goals:  no barriers identified  Plan: Hypertension treatment:  continue current medications

## 2011-12-16 ENCOUNTER — Encounter: Payer: PRIVATE HEALTH INSURANCE | Admitting: Internal Medicine

## 2012-05-12 ENCOUNTER — Telehealth: Payer: Self-pay | Admitting: Internal Medicine

## 2012-05-12 NOTE — Telephone Encounter (Signed)
Patient was mailed letter to schedule health maintenance appointment on November 13.  The letter was returned on 05-12-18 stating temporarily away.

## 2012-06-03 NOTE — Addendum Note (Signed)
Addended by: Bufford Spikes on: 06/03/2012 11:30 AM   Modules accepted: Orders

## 2012-07-23 ENCOUNTER — Telehealth: Payer: Self-pay | Admitting: Internal Medicine

## 2012-07-23 ENCOUNTER — Encounter: Payer: Self-pay | Admitting: Internal Medicine

## 2012-07-23 NOTE — Telephone Encounter (Signed)
Patient's sister returned my phone call from earlier today about scheduling him a HM appt.  She has transferred Alexander Rosales' care.  All of his medical needs and refills are taken care of by [facility].  Dr. Jory Ee name has been removed as the PCP.

## 2016-07-16 DIAGNOSIS — M545 Low back pain: Secondary | ICD-10-CM | POA: Diagnosis not present

## 2016-07-16 DIAGNOSIS — H913 Deaf nonspeaking, not elsewhere classified: Secondary | ICD-10-CM | POA: Diagnosis not present

## 2016-07-16 DIAGNOSIS — H548 Legal blindness, as defined in USA: Secondary | ICD-10-CM | POA: Diagnosis not present

## 2016-07-16 DIAGNOSIS — M25569 Pain in unspecified knee: Secondary | ICD-10-CM | POA: Diagnosis not present

## 2016-08-05 ENCOUNTER — Emergency Department (HOSPITAL_COMMUNITY)
Admission: EM | Admit: 2016-08-05 | Discharge: 2016-08-05 | Disposition: A | Discharge status: Home / Self Care | Payer: Medicare Other | Attending: Emergency Medicine | Admitting: Emergency Medicine

## 2016-08-05 ENCOUNTER — Emergency Department (HOSPITAL_COMMUNITY): Payer: Medicare Other

## 2016-08-05 DIAGNOSIS — Z79899 Other long term (current) drug therapy: Secondary | ICD-10-CM | POA: Insufficient documentation

## 2016-08-05 DIAGNOSIS — I10 Essential (primary) hypertension: Secondary | ICD-10-CM | POA: Diagnosis not present

## 2016-08-05 DIAGNOSIS — R55 Syncope and collapse: Secondary | ICD-10-CM | POA: Insufficient documentation

## 2016-08-05 DIAGNOSIS — R404 Transient alteration of awareness: Secondary | ICD-10-CM | POA: Diagnosis not present

## 2016-08-05 LAB — CBC
HCT: 35.2 % — ABNORMAL LOW (ref 39.0–52.0)
Hemoglobin: 11.6 g/dL — ABNORMAL LOW (ref 13.0–17.0)
MCH: 29.3 pg (ref 26.0–34.0)
MCHC: 33 g/dL (ref 30.0–36.0)
MCV: 88.9 fL (ref 78.0–100.0)
PLATELETS: 191 10*3/uL (ref 150–400)
RBC: 3.96 MIL/uL — AB (ref 4.22–5.81)
RDW: 13.3 % (ref 11.5–15.5)
WBC: 8.3 10*3/uL (ref 4.0–10.5)

## 2016-08-05 LAB — COMPREHENSIVE METABOLIC PANEL
ALK PHOS: 43 U/L (ref 38–126)
ALT: 12 U/L — AB (ref 17–63)
AST: 18 U/L (ref 15–41)
Albumin: 3.3 g/dL — ABNORMAL LOW (ref 3.5–5.0)
Anion gap: 12 (ref 5–15)
BILIRUBIN TOTAL: 0.9 mg/dL (ref 0.3–1.2)
BUN: 23 mg/dL — AB (ref 6–20)
CALCIUM: 8.9 mg/dL (ref 8.9–10.3)
CO2: 28 mmol/L (ref 22–32)
Chloride: 102 mmol/L (ref 101–111)
Creatinine, Ser: 1.53 mg/dL — ABNORMAL HIGH (ref 0.61–1.24)
GFR calc non Af Amer: 40 mL/min — ABNORMAL LOW (ref >60)
GFR, EST AFRICAN AMERICAN: 46 mL/min — AB (ref >60)
Glucose, Bld: 147 mg/dL — ABNORMAL HIGH (ref 65–99)
Potassium: 3.1 mmol/L — ABNORMAL LOW (ref 3.5–5.1)
Sodium: 142 mmol/L (ref 135–145)
TOTAL PROTEIN: 5.9 g/dL — AB (ref 6.5–8.1)

## 2016-08-05 LAB — I-STAT TROPONIN, ED: TROPONIN I, POC: 0 ng/mL (ref 0.00–0.08)

## 2016-08-05 NOTE — ED Triage Notes (Signed)
Pt. Had a syncopal event while sitting today. Did not hit head or actually fall. Pt is blind and deaf, but vitals WDL

## 2016-08-05 NOTE — Progress Notes (Signed)
CSW spoke with pt's SIL re: her desire to place pt in a facility.  However, she is not the POA or next of kin to pt and pt has dementia. Per Ms.Otani, that she took pt out of [facility] because his sister wanted to be closer to him, and now, she is the only person caring for him.  She states that she is pt's payee, but that he has another payee (?his sister) who gets his retirement income.  CSW recommended that Ms.Ricardo contact pt's POA and discuss care/for placement for pt, as she is no longer interested in being responsible for pt.  Ms. Zirkel is agreeable to taking pt back into her home, until alternative arrangements can be made for his care.  She is also agreeable to [facility] services, including SW, being arranged through [facility].  RNCM aware.

## 2016-08-05 NOTE — ED Provider Notes (Signed)
[place] DEPT Provider Note   CSN: [id number] Arrival date & time: 08/05/16  1402     History   Chief Complaint Chief Complaint  Patient presents with  . Loss of Consciousness    Level V caveat: Deaf and Blind   HPI RONEN KAMMER is a 83 y.o. male.  HPI 77 of history is obtained from the sister-in-law.  Sister-in-law reports the patient and her [place] office when the patient's head leaned backwards and he became unresponsive.  She noticed drool coming outside of his mouth.  He did not change colors.  She noted no seizure activity.  She states this lasted 10-15 minutes.  Patient never lost a pulse.  Patient is now returned to baseline mental status.  No recent illness.  No recent trauma to his head.  Patient is deaf and blind and is unable to provide any history at this time.  He sometimes can hand sign but there is no interpreter available at this time.  The patient has been living with his sister-in-law over the past several months.  He's been compliant with his medications.   No past medical history on file.  Patient Active Problem List   Diagnosis Date Noted  . Acute renal failure ([place]) 09/19/2011  . Frequency of urination 08/20/2011  . Hypokalemia 08/20/2011  . Back pain 08/20/2011  . CHEST PAIN UNSPECIFIED 04/06/2009  . RECTAL BLEEDING 11/09/2007  . HEMATURIA UNSPECIFIED 11/09/2007  . FECAL OCCULT BLOOD 11/09/2007  . NEOPLASM, MALIGNANT, KIDNEY 02/07/2007  . STATUS, MENTAL, ALTERED 01/30/2007  . HYPERCHOLESTEROLEMIA 04/10/2006  . BLINDNESS, BILATERAL 04/10/2006  . DEAF MUTISM 04/10/2006  . HYPERTENSION 04/10/2006  . CAD 04/10/2006  . GERD 04/10/2006  . CHOLEDOCHOLITHIASIS 04/10/2006  . DIARRHEA 04/10/2006  . CHOLECYSTECTOMY, HX OF 04/10/2006    No past surgical history on file.     Home Medications    Prior to Admission medications   Medication Sig Start Date End Date Taking? Authorizing Provider  amLODipine-benazepril (LOTREL)  10-20 MG per capsule Take 1 capsule by mouth daily. 08/19/11   Pedro Earls, MD  Cholecalciferol (VITAMIN D3) 1000 UNITS CAPS Take 1 capsule (1,000 Units total) by mouth every morning. 08/19/11   Pedro Earls, MD  diclofenac sodium (VOLTAREN) 1 % GEL Apply 1 application topically as needed. 08/19/11   Pedro Earls, MD  hydrALAZINE (APRESOLINE) 25 MG tablet Take 1 tablet (25 mg total) by mouth 2 (two) times daily. 08/19/11   Pedro Earls, MD  metoprolol succinate (TOPROL XL) 100 MG 24 hr tablet Take 1 tablet (100 mg total) by mouth daily. Take with or immediately following a meal. 08/19/11 08/18/12  Pedro Earls, MD  pantoprazole (PROTONIX) 40 MG tablet Take 1 tablet (40 mg total) by mouth daily. 08/19/11   Pedro Earls, MD  potassium chloride (KLOR-CON) 20 MEQ packet Take 2 tabs by mouth daily for 5 days and then 1 tab daily from there onwards. 09/19/11   Pedro Earls, MD  simvastatin (ZOCOR) 10 MG tablet Take 10 mg by mouth at bedtime.    Historical Provider, MD  Tamsulosin HCl (FLOMAX) 0.4 MG CAPS Take 1 capsule (0.4 mg total) by mouth daily. 09/16/11   Pedro Earls, MD    Family History No family history on file.  Social History Social History  Substance Use Topics  . Smoking status: Never Smoker  . Smokeless tobacco: Not on file  . Alcohol use Not on file     Allergies   Aspirin   Review of Systems  Review of Systems  Unable to perform ROS: Other     Physical Exam Updated Vital Signs BP 119/65   Pulse 64   Resp 14   SpO2 98%   Physical Exam  Constitutional: He appears well-developed and well-nourished.  HENT:  Head: Normocephalic and atraumatic.  Eyes: EOM are normal. Pupils are equal, round, and reactive to light.  Neck: Normal range of motion.  Cardiovascular: Normal rate, regular rhythm, normal heart sounds and intact distal pulses.   Pulmonary/Chest: Effort normal and breath sounds normal. No respiratory distress.  Abdominal: Soft. He exhibits no distension.  There is no tenderness.  Musculoskeletal: Normal range of motion.  Neurological: He is alert.  5/5 strength in major muscle groups of  bilateral upper and lower extremities.  No facial asymetry.  Follows commands  Skin: Skin is warm and dry.  Psychiatric: He has a normal mood and affect. Judgment normal.  Nursing note and vitals reviewed.    ED Treatments / Results  Labs (all labs ordered are listed, but only abnormal results are displayed) Labs Reviewed  COMPREHENSIVE METABOLIC PANEL - Abnormal; Notable for the following:       Result Value   Potassium 3.1 (*)    Glucose, Bld 147 (*)    BUN 23 (*)    Creatinine, Ser 1.53 (*)    Total Protein 5.9 (*)    Albumin 3.3 (*)    ALT 12 (*)    GFR calc non Af Amer 40 (*)    GFR calc Af Amer 46 (*)    All other components within normal limits  CBC - Abnormal; Notable for the following:    RBC 3.96 (*)    Hemoglobin 11.6 (*)    HCT 35.2 (*)    All other components within normal limits  URINALYSIS, ROUTINE W REFLEX MICROSCOPIC  I-STAT TROPOININ, ED    EKG  EKG Interpretation  Date/Time:  Monday August 05 2016 14:05:07 EST Ventricular Rate:  70 PR Interval:    QRS Duration: 88 QT Interval:  405 QTC Calculation: 437 R Axis:   59 Text Interpretation:  Sinus rhythm Left atrial enlargement Left ventricular hypertrophy No significant change was found Confirmed by Kambria Grima  MD, Lennette Bihari ([id number]) on 08/05/2016 2:13:34 PM       Radiology Ct Head Wo Contrast  Result Date: 08/05/2016 CLINICAL DATA:  Possible seizure. EXAM: CT HEAD WITHOUT CONTRAST TECHNIQUE: Contiguous axial images were obtained from the base of the skull through the vertex without intravenous contrast. COMPARISON:  03/24/2006 FINDINGS: Brain: No evidence of acute infarction, hemorrhage, hydrocephalus, extra-axial collection or mass lesion/mass effect. No cortical finding to explain seizure. Chronic white matter disease, likely chronic microvascular ischemia. This has a  stable biparietal predilection. Mild generalized volume loss, progressed as expected over the long interval. Questionable small remote right cerebellar infarct. Vascular: Atherosclerotic calcification. Skull: No acute or aggressive finding Sinuses/Orbits: Negative IMPRESSION: Senescent changes without acute finding. Electronically Signed   By: Monte Fantasia M.D.   On: 08/05/2016 17:08    Procedures Procedures (including critical care time)  Medications Ordered in ED Medications - No data to display   Initial Impression / Assessment and Plan / ED Course  I have reviewed the triage vital signs and the nursing notes.  Pertinent labs & imaging results that were available during my care of the patient were reviewed by me and considered in my medical decision making (see chart for details).     Questionable seizure while here the history.  EKG without ischemic changes.  Patient will remain on telemetry at this time.  Labs pending.  Head CT pending.  Final Clinical Impressions(s) / ED Diagnoses   Final diagnoses:  Syncope, unspecified syncope type    New Prescriptions New Prescriptions   No medications on file     Jola Schmidt, MD 08/05/16 1715

## 2016-08-13 DIAGNOSIS — M25569 Pain in unspecified knee: Secondary | ICD-10-CM | POA: Diagnosis not present

## 2016-08-13 DIAGNOSIS — M545 Low back pain: Secondary | ICD-10-CM | POA: Diagnosis not present

## 2016-08-13 DIAGNOSIS — Z111 Encounter for screening for respiratory tuberculosis: Secondary | ICD-10-CM | POA: Diagnosis not present

## 2016-08-15 DIAGNOSIS — Z111 Encounter for screening for respiratory tuberculosis: Secondary | ICD-10-CM | POA: Diagnosis not present

## 2016-09-19 DIAGNOSIS — K219 Gastro-esophageal reflux disease without esophagitis: Secondary | ICD-10-CM | POA: Diagnosis not present

## 2016-09-19 DIAGNOSIS — Z111 Encounter for screening for respiratory tuberculosis: Secondary | ICD-10-CM | POA: Diagnosis not present

## 2016-09-19 DIAGNOSIS — N400 Enlarged prostate without lower urinary tract symptoms: Secondary | ICD-10-CM | POA: Diagnosis not present

## 2016-09-19 DIAGNOSIS — I10 Essential (primary) hypertension: Secondary | ICD-10-CM | POA: Diagnosis not present

## 2016-09-25 DIAGNOSIS — H401113 Primary open-angle glaucoma, right eye, severe stage: Secondary | ICD-10-CM | POA: Diagnosis not present

## 2016-09-25 DIAGNOSIS — H401123 Primary open-angle glaucoma, left eye, severe stage: Secondary | ICD-10-CM | POA: Diagnosis not present

## 2016-09-25 DIAGNOSIS — H5410 Blindness, one eye, low vision other eye, unspecified eyes: Secondary | ICD-10-CM | POA: Diagnosis not present

## 2016-09-25 DIAGNOSIS — H35033 Hypertensive retinopathy, bilateral: Secondary | ICD-10-CM | POA: Diagnosis not present

## 2016-09-27 ENCOUNTER — Inpatient Hospital Stay (HOSPITAL_COMMUNITY)
Admission: EM | Admit: 2016-09-27 | Discharge: 2016-10-01 | DRG: 683 | Disposition: A | Discharge status: Skilled Nursing Facility | Payer: Medicare Other | Attending: Family Medicine | Admitting: Family Medicine

## 2016-09-27 ENCOUNTER — Encounter (HOSPITAL_COMMUNITY): Payer: Self-pay | Admitting: *Deleted

## 2016-09-27 DIAGNOSIS — N139 Obstructive and reflux uropathy, unspecified: Secondary | ICD-10-CM

## 2016-09-27 DIAGNOSIS — H47619 Cortical blindness, unspecified side of brain: Secondary | ICD-10-CM | POA: Diagnosis not present

## 2016-09-27 DIAGNOSIS — E860 Dehydration: Secondary | ICD-10-CM | POA: Diagnosis present

## 2016-09-27 DIAGNOSIS — E875 Hyperkalemia: Secondary | ICD-10-CM | POA: Diagnosis not present

## 2016-09-27 DIAGNOSIS — N183 Chronic kidney disease, stage 3 (moderate): Secondary | ICD-10-CM | POA: Diagnosis present

## 2016-09-27 DIAGNOSIS — H543 Unqualified visual loss, both eyes: Secondary | ICD-10-CM | POA: Diagnosis not present

## 2016-09-27 DIAGNOSIS — E785 Hyperlipidemia, unspecified: Secondary | ICD-10-CM | POA: Diagnosis present

## 2016-09-27 DIAGNOSIS — I129 Hypertensive chronic kidney disease with stage 1 through stage 4 chronic kidney disease, or unspecified chronic kidney disease: Secondary | ICD-10-CM | POA: Diagnosis present

## 2016-09-27 DIAGNOSIS — N401 Enlarged prostate with lower urinary tract symptoms: Secondary | ICD-10-CM | POA: Diagnosis not present

## 2016-09-27 DIAGNOSIS — H913 Deaf nonspeaking, not elsewhere classified: Secondary | ICD-10-CM | POA: Diagnosis not present

## 2016-09-27 DIAGNOSIS — Z66 Do not resuscitate: Secondary | ICD-10-CM | POA: Diagnosis present

## 2016-09-27 DIAGNOSIS — N189 Chronic kidney disease, unspecified: Secondary | ICD-10-CM | POA: Diagnosis not present

## 2016-09-27 DIAGNOSIS — Z79899 Other long term (current) drug therapy: Secondary | ICD-10-CM

## 2016-09-27 DIAGNOSIS — N3000 Acute cystitis without hematuria: Secondary | ICD-10-CM | POA: Diagnosis not present

## 2016-09-27 DIAGNOSIS — N138 Other obstructive and reflux uropathy: Secondary | ICD-10-CM | POA: Diagnosis not present

## 2016-09-27 DIAGNOSIS — N390 Urinary tract infection, site not specified: Secondary | ICD-10-CM | POA: Diagnosis present

## 2016-09-27 DIAGNOSIS — R630 Anorexia: Secondary | ICD-10-CM | POA: Diagnosis present

## 2016-09-27 DIAGNOSIS — R531 Weakness: Secondary | ICD-10-CM | POA: Diagnosis not present

## 2016-09-27 DIAGNOSIS — D649 Anemia, unspecified: Secondary | ICD-10-CM | POA: Diagnosis present

## 2016-09-27 DIAGNOSIS — H547 Unspecified visual loss: Secondary | ICD-10-CM | POA: Diagnosis present

## 2016-09-27 DIAGNOSIS — R627 Adult failure to thrive: Secondary | ICD-10-CM | POA: Diagnosis not present

## 2016-09-27 DIAGNOSIS — R339 Retention of urine, unspecified: Secondary | ICD-10-CM | POA: Diagnosis not present

## 2016-09-27 DIAGNOSIS — K219 Gastro-esophageal reflux disease without esophagitis: Secondary | ICD-10-CM | POA: Diagnosis present

## 2016-09-27 DIAGNOSIS — N1330 Unspecified hydronephrosis: Secondary | ICD-10-CM | POA: Diagnosis not present

## 2016-09-27 DIAGNOSIS — N179 Acute kidney failure, unspecified: Principal | ICD-10-CM | POA: Diagnosis present

## 2016-09-27 DIAGNOSIS — I10 Essential (primary) hypertension: Secondary | ICD-10-CM | POA: Diagnosis not present

## 2016-09-27 DIAGNOSIS — R404 Transient alteration of awareness: Secondary | ICD-10-CM | POA: Diagnosis not present

## 2016-09-27 DIAGNOSIS — E7800 Pure hypercholesterolemia, unspecified: Secondary | ICD-10-CM | POA: Diagnosis present

## 2016-09-27 LAB — COMPREHENSIVE METABOLIC PANEL
ALT: 23 U/L (ref 17–63)
AST: 25 U/L (ref 15–41)
Albumin: 3.5 g/dL (ref 3.5–5.0)
Alkaline Phosphatase: 55 U/L (ref 38–126)
Anion gap: 25 — ABNORMAL HIGH (ref 5–15)
BUN: 176 mg/dL — AB (ref 6–20)
CHLORIDE: 89 mmol/L — AB (ref 101–111)
CO2: 16 mmol/L — AB (ref 22–32)
CREATININE: 24.64 mg/dL — AB (ref 0.61–1.24)
Calcium: 8.8 mg/dL — ABNORMAL LOW (ref 8.9–10.3)
GFR calc Af Amer: 2 mL/min — ABNORMAL LOW (ref >60)
GFR calc non Af Amer: 1 mL/min — ABNORMAL LOW (ref >60)
GLUCOSE: 116 mg/dL — AB (ref 65–99)
Potassium: 6.4 mmol/L (ref 3.5–5.1)
SODIUM: 130 mmol/L — AB (ref 135–145)
Total Bilirubin: 0.6 mg/dL (ref 0.3–1.2)
Total Protein: 6.9 g/dL (ref 6.5–8.1)

## 2016-09-27 LAB — URINALYSIS, ROUTINE W REFLEX MICROSCOPIC
BILIRUBIN URINE: NEGATIVE
Glucose, UA: NEGATIVE mg/dL
Ketones, ur: NEGATIVE mg/dL
NITRITE: NEGATIVE
Protein, ur: 100 mg/dL — AB
SPECIFIC GRAVITY, URINE: 1.01 (ref 1.005–1.030)
SQUAMOUS EPITHELIAL / LPF: NONE SEEN
pH: 6 (ref 5.0–8.0)

## 2016-09-27 LAB — CBC
HCT: 33.3 % — ABNORMAL LOW (ref 39.0–52.0)
HEMOGLOBIN: 11.7 g/dL — AB (ref 13.0–17.0)
MCH: 29.8 pg (ref 26.0–34.0)
MCHC: 35.1 g/dL (ref 30.0–36.0)
MCV: 84.7 fL (ref 78.0–100.0)
PLATELETS: 185 10*3/uL (ref 150–400)
RBC: 3.93 MIL/uL — AB (ref 4.22–5.81)
RDW: 12.8 % (ref 11.5–15.5)
WBC: 13.2 10*3/uL — AB (ref 4.0–10.5)

## 2016-09-27 MED ORDER — HEPARIN SODIUM (PORCINE) 5000 UNIT/ML IJ SOLN
5000.0000 [IU] | Freq: Three times a day (TID) | INTRAMUSCULAR | Status: DC
  Administered 2016-09-27 – 2016-09-30 (×8): 5000 [IU] via SUBCUTANEOUS
  Filled 2016-09-27 (×8): qty 1

## 2016-09-27 MED ORDER — SODIUM POLYSTYRENE SULFONATE 15 GM/60ML PO SUSP
15.0000 g | Freq: Once | ORAL | Status: AC
  Administered 2016-09-27: 15 g via ORAL
  Filled 2016-09-27: qty 60

## 2016-09-27 MED ORDER — DEXTROSE 5 % IV SOLN
1.0000 g | INTRAVENOUS | Status: DC
  Administered 2016-09-27 – 2016-09-30 (×4): 1 g via INTRAVENOUS
  Filled 2016-09-27 (×5): qty 10

## 2016-09-27 MED ORDER — SODIUM CHLORIDE 0.9% FLUSH
3.0000 mL | Freq: Two times a day (BID) | INTRAVENOUS | Status: DC
  Administered 2016-09-27 – 2016-10-01 (×7): 3 mL via INTRAVENOUS

## 2016-09-27 MED ORDER — DEXTROSE IN LACTATED RINGERS 5 % IV SOLN
INTRAVENOUS | Status: AC
  Administered 2016-09-27 – 2016-09-28 (×3): via INTRAVENOUS

## 2016-09-27 MED ORDER — HYDRALAZINE HCL 25 MG PO TABS
25.0000 mg | ORAL_TABLET | Freq: Two times a day (BID) | ORAL | Status: DC
  Administered 2016-09-28 – 2016-10-01 (×6): 25 mg via ORAL
  Filled 2016-09-27 (×8): qty 1

## 2016-09-27 MED ORDER — SODIUM CHLORIDE 0.9 % IV BOLUS (SEPSIS)
500.0000 mL | Freq: Once | INTRAVENOUS | Status: AC
  Administered 2016-09-27: 500 mL via INTRAVENOUS

## 2016-09-27 MED ORDER — CALCIUM GLUCONATE 10 % IV SOLN: 1.0000 g | Freq: Once | INTRAVENOUS | Status: DC

## 2016-09-27 MED ORDER — CALCIUM GLUCONATE 10 % IV SOLN
1.0000 g | Freq: Once | INTRAVENOUS | Status: AC
  Administered 2016-09-27: 1 g via INTRAVENOUS
  Filled 2016-09-27: qty 10

## 2016-09-27 MED ORDER — TAMSULOSIN HCL 0.4 MG PO CAPS
0.4000 mg | ORAL_CAPSULE | Freq: Every day | ORAL | Status: DC
Start: 2016-09-28 — End: 2016-10-01
  Administered 2016-09-28 – 2016-10-01 (×4): 0.4 mg via ORAL
  Filled 2016-09-27 (×4): qty 1

## 2016-09-27 NOTE — H&P (Signed)
History and Physical    Alexander Rosales HAL:[id number] DOB: 09-02-1929 DOA: 09/27/2016  I have briefly reviewed the patient's prior medical records in [facility]  PCP: Kristine Garbe, MD  Patient coming from: Skilled nursing facility  Chief Complaint: Failure to thrive  HPI: Alexander Rosales is a 83 y.o. male with medical history significant of hypertension, BPH, hyperlipidemia, blind, deaf, mute, is being brought from his skilled nursing facility for failure to thrive.  Patient on my evaluation has his eyes closed, responds when touched on his shoulder and appears alert, he is able to drink liquids per the nurse, however he is not communicative and does not interact with people around him so history is per EDP.  I was able to get in touch with patient's sister-in-law Langley Gauss [phone]), and she mentioned that patient has been doing well, he is to be a patient of internal medicine teaching service, however he transition his primary care to the pace program, and he left that last September in 2017 and now he is seen by Dr. Ayesha Rumpf.  She also mention he has a durable DNR form in his file.  She does not know much about what happened the last few days.  I was unable to get in touch with patient's POA Sherill [phone]).  Patient has been in the nursing home for the past 3 weeks  ED Course: In the ED patient's vital signs are stable, he is afebrile, he is normotensive, his blood work is significant for sodium of 130, potassium of 6.4, bicarb of 16, his BUN is 176 and his creatinine is 24.6.  He has mild leukocytosis with a white count of 13.  He was given Kayexalate in the ED, calcium gluconate, and TRH was asked for admission.  EKG without changes.  Review of Systems: As per HPI otherwise 10 point review of systems negative.   No past medical history on file.  Unable to obtain past medical history due to underlying condition as per HPI  No past surgical history on file.   reports that he has  never smoked. He does not have any smokeless tobacco history on file. His alcohol and drug histories are not on file.  Allergies  Allergen Reactions  . Aspirin     REACTION: unknown reaction   Unable to obtain family history due to underlying condition as per HPI  Prior to Admission medications   Medication Sig Start Date End Date Taking? Authorizing Provider  amLODipine-benazepril (LOTREL) 10-20 MG per capsule Take 1 capsule by mouth daily. 08/19/11   Pedro Earls, MD  Cholecalciferol (VITAMIN D3) 1000 UNITS CAPS Take 1 capsule (1,000 Units total) by mouth every morning. 08/19/11   Pedro Earls, MD  diclofenac sodium (VOLTAREN) 1 % GEL Apply 1 application topically as needed. 08/19/11   Pedro Earls, MD  hydrALAZINE (APRESOLINE) 25 MG tablet Take 1 tablet (25 mg total) by mouth 2 (two) times daily. 08/19/11   Pedro Earls, MD  metoprolol succinate (TOPROL XL) 100 MG 24 hr tablet Take 1 tablet (100 mg total) by mouth daily. Take with or immediately following a meal. 08/19/11 08/18/12  Pedro Earls, MD  pantoprazole (PROTONIX) 40 MG tablet Take 1 tablet (40 mg total) by mouth daily. 08/19/11   Pedro Earls, MD  potassium chloride (KLOR-CON) 20 MEQ packet Take 2 tabs by mouth daily for 5 days and then 1 tab daily from there onwards. 09/19/11   Pedro Earls, MD  simvastatin (ZOCOR) 10 MG tablet Take 10 mg  by mouth at bedtime.    Historical Provider, MD  Tamsulosin HCl (FLOMAX) 0.4 MG CAPS Take 1 capsule (0.4 mg total) by mouth daily. 09/16/11   Pedro Earls, MD    Physical Exam: Vitals:   09/27/16 1425 09/27/16 1450 09/27/16 1654  BP: 125/65 135/67 (!) 153/89  Pulse: 66 70 88  Resp: 17 18 14   Temp: (!) 96.3 F (35.7 C)  (!) 96.6 F (35.9 C)  TempSrc: Axillary  Axillary  SpO2: 100% 99% 100%      Constitutional: NAD, calm, comfortable, eyes closed Vitals:   09/27/16 1425 09/27/16 1450 09/27/16 1654  BP: 125/65 135/67 (!) 153/89  Pulse: 66 70 88  Resp: 17 18 14   Temp: (!) 96.3 F  (35.7 C)  (!) 96.6 F (35.9 C)  TempSrc: Axillary  Axillary  SpO2: 100% 99% 100%   Eyes: lids and conjunctivae normal ENMT: Mucous membranes are dry.  Neck: normal, supple, no masses Respiratory: clear to auscultation bilaterally, no wheezing, no crackles. Normal respiratory effort. No accessory muscle use.  Cardiovascular: Regular rate and rhythm, no murmurs / rubs / gallops. 2+ extremity edema. 2+ pedal pulses.  Abdomen: no tenderness, no masses palpated.  Musculoskeletal: no clubbing / cyanosis.  Skin: no rashes, lesions, ulcers. No induration Neurologic: Appears to move all 4 extremities, does not follow commands Psychiatric: Unable to hear, talk, see  Labs on Admission: I have personally reviewed following labs and imaging studies  CBC:  Recent Labs Lab 09/27/16 1445  WBC 13.2*  HGB 11.7*  HCT 33.3*  MCV 84.7  PLT 322   Basic Metabolic Panel:  Recent Labs Lab 09/27/16 1445  NA 130*  K 6.4*  CL 89*  CO2 16*  GLUCOSE 116*  BUN 176*  CREATININE 24.64*  CALCIUM 8.8*   GFR: CrCl cannot be calculated (Unknown ideal weight.). Liver Function Tests:  Recent Labs Lab 09/27/16 1445  AST 25  ALT 23  ALKPHOS 55  BILITOT 0.6  PROT 6.9  ALBUMIN 3.5   No results for input(s): LIPASE, AMYLASE in the last 168 hours. No results for input(s): AMMONIA in the last 168 hours. Coagulation Profile: No results for input(s): INR, PROTIME in the last 168 hours. Cardiac Enzymes: No results for input(s): CKTOTAL, CKMB, CKMBINDEX, TROPONINI in the last 168 hours. BNP (last 3 results) No results for input(s): PROBNP in the last 8760 hours. HbA1C: No results for input(s): HGBA1C in the last 72 hours. CBG: No results for input(s): GLUCAP in the last 168 hours. Lipid Profile: No results for input(s): CHOL, HDL, LDLCALC, TRIG, CHOLHDL, LDLDIRECT in the last 72 hours. Thyroid Function Tests: No results for input(s): TSH, T4TOTAL, FREET4, T3FREE, THYROIDAB in the last 72  hours. Anemia Panel: No results for input(s): VITAMINB12, FOLATE, FERRITIN, TIBC, IRON, RETICCTPCT in the last 72 hours. Urine analysis:    Component Value Date/Time   COLORURINE YELLOW 09/16/2011 1400   APPEARANCEUR CLOUDY (A) 09/16/2011 1400   LABSPEC 1.023 09/16/2011 1400   PHURINE 6.0 09/16/2011 1400   GLUCOSEU NEG 09/16/2011 1400   HGBUR NEG 09/16/2011 1400   BILIRUBINUR SMALL (A) 09/16/2011 1400   KETONESUR TRACE (A) 09/16/2011 1400   PROTEINUR 100 (A) 09/16/2011 1400   UROBILINOGEN 0.2 09/16/2011 1400   NITRITE NEG 09/16/2011 1400   LEUKOCYTESUR NEG 09/16/2011 1400     Radiological Exams on Admission: No results found.  EKG: Independently reviewed.  Sinus rhythm  Assessment/Plan Active Problems:   Blindness of both eyes   Deaf mutism, acquired  Hyperkalemia   Acute renal failure on chronic kidney disease stage III -Unable to see a good baseline creatinine, he has a normal Cr value in 2010, he appears to have had CKD stage III in 2013 -Most recent creatinine was done in February 2018 and was 1.5.  He was in the ED at that time with a syncopal episode -Bladder scan showed close to a liter of urine, his renal failure seems to be obstructive in nature, Foley catheter was placed with immediate decompression.  We will send a urinalysis -Provide IV fluids -EDP is contacting nephrology, does not appear a dialysis candidate at this point, will admit patient to telemetry closely monitor potassium and renal function  Hypertension -Resume home medications, he is on the hypertensive side in the emergency room  Deaf mutism, blindness, -Patient noninteractive however will drink fluids if nurse brings it to him  Hyperkalemia -No EKG changes, monitor on telemetry, he was able to take the Kayexalate  BPH -With acute urinary retention resulting in acute renal failure, Foley catheter was placed in the ED  UTI -Patient unable to voice any symptoms, however has leukocytosis,  has acute urinary retention, and urinalysis shows large leukocytes and too numerous to count white blood cells. -We will place him empiric ceftriaxone, send urine cultures    DVT prophylaxis: heparin  Code Status: DNR  Family Communication: d/w sister in law over the phone Disposition Plan: Admit to telemetry, expect SNF on discharge Consults called: Nephrology -consult in progress by EDP    Admission status: inpatient    At the time of admission, it appears that the appropriate admission status for this patient is INPATIENT. This is judged to be reasonable and necessary in order to provide the required high service intensity to ensure the patient's safety given the presenting symptoms, physical exam findings, and initial radiographic and laboratory data in the context of their chronic comorbidities. Current circumstances are significant renal failure and uremia, hyperkalemia, urinary tract infection, failure to thrive, profound dehydration, and it is felt to place patient at high risk for further clinical deterioration threatening life, limb, or organ. Moreover, it is my clinical judgment that the patient will require inpatient hospital care spanning beyond 2 midnights from the point of admission and that early discharge would result in unnecessary risk of decompensation and readmission or threat to life, limb or bodily function.   Marzetta Board, MD [facility] Pager [phone]  If 7PM-7AM, please contact night-coverage [email or web address] Password [email or web address]  09/27/2016, 5:19 PM

## 2016-09-27 NOTE — ED Triage Notes (Addendum)
Patient here from [facility] with complaints of failure to thrive. CBG 135. Staff reports patient is not eating, hasn't eaten in 1 week, but has been drinking. Nonverbal and blind

## 2016-09-27 NOTE — ED Notes (Signed)
Bed: TG62 Expected date:  Expected time:  Means of arrival:  Comments: EMS failure to thrive

## 2016-09-27 NOTE — ED Provider Notes (Signed)
[facility] DEPT Provider Note   CSN: [id number] Arrival date & time: 09/27/16  1413     History   Chief Complaint Chief Complaint  Patient presents with  . Failure To Thrive  . Anorexia    HPI Alexander Rosales is a 83 y.o. male.  Patient from ecf, with report very poor po intake in the past week.  Pt at baseline is blind and nonverbal - level 5 caveat. No report of trauma or fall. No noted change in meds. No reported fevers.    The history is provided by the patient, the EMS personnel and the nursing home. The history is limited by the condition of the patient.    No past medical history on file.  Patient Active Problem List   Diagnosis Date Noted  . Acute renal failure ([place]) 09/19/2011  . Frequency of urination 08/20/2011  . Hypokalemia 08/20/2011  . Back pain 08/20/2011  . CHEST PAIN UNSPECIFIED 04/06/2009  . RECTAL BLEEDING 11/09/2007  . HEMATURIA UNSPECIFIED 11/09/2007  . FECAL OCCULT BLOOD 11/09/2007  . NEOPLASM, MALIGNANT, KIDNEY 02/07/2007  . STATUS, MENTAL, ALTERED 01/30/2007  . HYPERCHOLESTEROLEMIA 04/10/2006  . BLINDNESS, BILATERAL 04/10/2006  . DEAF MUTISM 04/10/2006  . HYPERTENSION 04/10/2006  . CAD 04/10/2006  . GERD 04/10/2006  . CHOLEDOCHOLITHIASIS 04/10/2006  . DIARRHEA 04/10/2006  . CHOLECYSTECTOMY, HX OF 04/10/2006    No past surgical history on file.     Home Medications    Prior to Admission medications   Medication Sig Start Date End Date Taking? Authorizing Provider  amLODipine-benazepril (LOTREL) 10-20 MG per capsule Take 1 capsule by mouth daily. 08/19/11   Pedro Earls, MD  Cholecalciferol (VITAMIN D3) 1000 UNITS CAPS Take 1 capsule (1,000 Units total) by mouth every morning. 08/19/11   Pedro Earls, MD  diclofenac sodium (VOLTAREN) 1 % GEL Apply 1 application topically as needed. 08/19/11   Pedro Earls, MD  hydrALAZINE (APRESOLINE) 25 MG tablet Take 1 tablet (25 mg total) by mouth 2 (two) times daily. 08/19/11   Pedro Earls, MD   metoprolol succinate (TOPROL XL) 100 MG 24 hr tablet Take 1 tablet (100 mg total) by mouth daily. Take with or immediately following a meal. 08/19/11 08/18/12  Pedro Earls, MD  pantoprazole (PROTONIX) 40 MG tablet Take 1 tablet (40 mg total) by mouth daily. 08/19/11   Pedro Earls, MD  potassium chloride (KLOR-CON) 20 MEQ packet Take 2 tabs by mouth daily for 5 days and then 1 tab daily from there onwards. 09/19/11   Pedro Earls, MD  simvastatin (ZOCOR) 10 MG tablet Take 10 mg by mouth at bedtime.    Historical Provider, MD  Tamsulosin HCl (FLOMAX) 0.4 MG CAPS Take 1 capsule (0.4 mg total) by mouth daily. 09/16/11   Pedro Earls, MD    Family History No family history on file.  Social History Social History  Substance Use Topics  . Smoking status: Never Smoker  . Smokeless tobacco: Not on file  . Alcohol use Not on file     Allergies   Aspirin   Review of Systems Review of Systems  Unable to perform ROS: Patient nonverbal  level 5 caveat.    Physical Exam Updated Vital Signs BP 125/65 (BP Location: Left Arm)   Pulse 66   Temp (!) 96.3 F (35.7 C) (Axillary)   Resp 17   SpO2 100%   Physical Exam  Constitutional: He appears well-developed and well-nourished. No distress.  HENT:  Head: Atraumatic.  Mouth/Throat: Oropharynx is clear and  moist.  Eyes: Conjunctivae are normal.  Neck: Neck supple. No tracheal deviation present.  No stiffness or rigidity. No bruits.   Cardiovascular: Normal rate, regular rhythm, normal heart sounds and intact distal pulses.  Exam reveals no gallop and no friction rub.   No murmur heard. Pulmonary/Chest: Effort normal and breath sounds normal. No accessory muscle usage. No respiratory distress.  Abdominal: Soft. Bowel sounds are normal. He exhibits no distension. There is no tenderness.  Genitourinary:  Genitourinary Comments: No cva tenderness  Musculoskeletal: He exhibits no edema.  Neurological: He is alert.  Patient awake and  alert. Mental state described as being c/w baseline. Moves bil ext purposefully.   Skin: Skin is warm and dry. No rash noted. He is not diaphoretic.  Psychiatric: He has a normal mood and affect.  Nursing note and vitals reviewed.    ED Treatments / Results  Labs (all labs ordered are listed, but only abnormal results are displayed) Results for orders placed or performed during the hospital encounter of 09/27/16  Culture, Urine  Result Value Ref Range   Specimen Description URINE, RANDOM    Special Requests NONE    Culture      NO GROWTH Performed at [facility], [address] [place], [place] [address]    Report Status 09/29/2016 FINAL   MRSA PCR Screening  Result Value Ref Range   MRSA by PCR NEGATIVE NEGATIVE  CBC  Result Value Ref Range   WBC 13.2 (H) 4.0 - 10.5 K/uL   RBC 3.93 (L) 4.22 - 5.81 MIL/uL   Hemoglobin 11.7 (L) 13.0 - 17.0 g/dL   HCT 33.3 (L) 39.0 - 52.0 %   MCV 84.7 78.0 - 100.0 fL   MCH 29.8 26.0 - 34.0 pg   MCHC 35.1 30.0 - 36.0 g/dL   RDW 12.8 11.5 - 15.5 %   Platelets 185 150 - 400 K/uL  Comprehensive metabolic panel  Result Value Ref Range   Sodium 130 (L) 135 - 145 mmol/L   Potassium 6.4 (HH) 3.5 - 5.1 mmol/L   Chloride 89 (L) 101 - 111 mmol/L   CO2 16 (L) 22 - 32 mmol/L   Glucose, Bld 116 (H) 65 - 99 mg/dL   BUN 176 (H) 6 - 20 mg/dL   Creatinine, Ser 24.64 (H) 0.61 - 1.24 mg/dL   Calcium 8.8 (L) 8.9 - 10.3 mg/dL   Total Protein 6.9 6.5 - 8.1 g/dL   Albumin 3.5 3.5 - 5.0 g/dL   AST 25 15 - 41 U/L   ALT 23 17 - 63 U/L   Alkaline Phosphatase 55 38 - 126 U/L   Total Bilirubin 0.6 0.3 - 1.2 mg/dL   GFR calc non Af Amer 1 (L) >60 mL/min   GFR calc Af Amer 2 (L) >60 mL/min   Anion gap 25 (H) 5 - 15  Urinalysis, Routine w reflex microscopic  Result Value Ref Range   Color, Urine YELLOW YELLOW   APPearance HAZY (A) CLEAR   Specific Gravity, Urine 1.010 1.005 - 1.030   pH 6.0 5.0 - 8.0   Glucose, UA NEGATIVE NEGATIVE mg/dL   Hgb urine  dipstick LARGE (A) NEGATIVE   Bilirubin Urine NEGATIVE NEGATIVE   Ketones, ur NEGATIVE NEGATIVE mg/dL   Protein, ur 100 (A) NEGATIVE mg/dL   Nitrite NEGATIVE NEGATIVE   Leukocytes, UA LARGE (A) NEGATIVE   RBC / HPF TOO NUMEROUS TO COUNT 0 - 5 RBC/hpf   WBC, UA TOO NUMEROUS TO COUNT  0 - 5 WBC/hpf   Bacteria, UA RARE (A) NONE SEEN   Squamous Epithelial / LPF NONE SEEN NONE SEEN  Comprehensive metabolic panel  Result Value Ref Range   Sodium 135 135 - 145 mmol/L   Potassium 3.5 3.5 - 5.1 mmol/L   Chloride 99 (L) 101 - 111 mmol/L   CO2 19 (L) 22 - 32 mmol/L   Glucose, Bld 153 (H) 65 - 99 mg/dL   BUN 116 (H) 6 - 20 mg/dL   Creatinine, Ser 10.48 (H) 0.61 - 1.24 mg/dL   Calcium 9.0 8.9 - 10.3 mg/dL   Total Protein 5.9 (L) 6.5 - 8.1 g/dL   Albumin 2.8 (L) 3.5 - 5.0 g/dL   AST 25 15 - 41 U/L   ALT 22 17 - 63 U/L   Alkaline Phosphatase 44 38 - 126 U/L   Total Bilirubin 1.1 0.3 - 1.2 mg/dL   GFR calc non Af Amer 4 (L) >60 mL/min   GFR calc Af Amer 4 (L) >60 mL/min   Anion gap 17 (H) 5 - 15  CBC  Result Value Ref Range   WBC 9.8 4.0 - 10.5 K/uL   RBC 3.55 (L) 4.22 - 5.81 MIL/uL   Hemoglobin 10.6 (L) 13.0 - 17.0 g/dL   HCT 30.2 (L) 39.0 - 52.0 %   MCV 85.1 78.0 - 100.0 fL   MCH 29.9 26.0 - 34.0 pg   MCHC 35.1 30.0 - 36.0 g/dL   RDW 12.8 11.5 - 15.5 %   Platelets 166 150 - 400 K/uL  CBC with Differential/Platelet  Result Value Ref Range   WBC 12.1 (H) 4.0 - 10.5 K/uL   RBC 3.58 (L) 4.22 - 5.81 MIL/uL   Hemoglobin 10.7 (L) 13.0 - 17.0 g/dL   HCT 31.3 (L) 39.0 - 52.0 %   MCV 87.4 78.0 - 100.0 fL   MCH 29.9 26.0 - 34.0 pg   MCHC 34.2 30.0 - 36.0 g/dL   RDW 12.7 11.5 - 15.5 %   Platelets 155 150 - 400 K/uL   Neutrophils Relative % 78 %   Neutro Abs 9.5 (H) 1.7 - 7.7 K/uL   Lymphocytes Relative 10 %   Lymphs Abs 1.2 0.7 - 4.0 K/uL   Monocytes Relative 11 %   Monocytes Absolute 1.3 (H) 0.1 - 1.0 K/uL   Eosinophils Relative 1 %   Eosinophils Absolute 0.1 0.0 - 0.7 K/uL    Basophils Relative 0 %   Basophils Absolute 0.0 0.0 - 0.1 K/uL  Comprehensive metabolic panel  Result Value Ref Range   Sodium 139 135 - 145 mmol/L   Potassium 3.1 (L) 3.5 - 5.1 mmol/L   Chloride 103 101 - 111 mmol/L   CO2 28 22 - 32 mmol/L   Glucose, Bld 107 (H) 65 - 99 mg/dL   BUN 50 (H) 6 - 20 mg/dL   Creatinine, Ser 2.44 (H) 0.61 - 1.24 mg/dL   Calcium 8.9 8.9 - 10.3 mg/dL   Total Protein 6.0 (L) 6.5 - 8.1 g/dL   Albumin 2.9 (L) 3.5 - 5.0 g/dL   AST 27 15 - 41 U/L   ALT 22 17 - 63 U/L   Alkaline Phosphatase 44 38 - 126 U/L   Total Bilirubin 1.0 0.3 - 1.2 mg/dL   GFR calc non Af Amer 23 (L) >60 mL/min   GFR calc Af Amer 26 (L) >60 mL/min   Anion gap 8 5 - 15   US Renal  Result Date: 09/28/2016  CLINICAL DATA:  Acute renal injury, is elevated BUN and creatinine. EXAM: RENAL / URINARY TRACT ULTRASOUND COMPLETE COMPARISON:  None. FINDINGS: Right Kidney: Length: 10.3 cm. Moderate hydronephrosis. Cortical thickness and echogenicity is grossly normal. Left Kidney: Length: 9.3 cm. Moderate hydronephrosis. Cortical thickness and echogenicity is grossly normal. Bladder: Decompressed by Foley catheter. Bladder wall appears thickened but this may be accentuated by the decompression. IMPRESSION: 1. Bilateral hydronephrosis, at least moderate in degree bilaterally. Consider CT for further characterization. 2. Bladder decompressed by Foley catheter. Questionable bladder wall thickening. Electronically Signed   By: Franki Cabot M.D.   On: 09/28/2016 16:37    EKG  EKG Interpretation  Date/Time:  Friday September 27 2016 16:46:04 EDT Ventricular Rate:  66 PR Interval:    QRS Duration: 99 QT Interval:  412 QTC Calculation: 432 R Axis:   47 Text Interpretation:  Sinus rhythm No significant change since last tracing Confirmed by Ashok Cordia  MD, Lennette Bihari ([id number]) on 09/27/2016 4:49:16 PM       Radiology US Renal  Result Date: 09/28/2016 CLINICAL DATA:  Acute renal injury, is elevated BUN and creatinine.  EXAM: RENAL / URINARY TRACT ULTRASOUND COMPLETE COMPARISON:  None. FINDINGS: Right Kidney: Length: 10.3 cm. Moderate hydronephrosis. Cortical thickness and echogenicity is grossly normal. Left Kidney: Length: 9.3 cm. Moderate hydronephrosis. Cortical thickness and echogenicity is grossly normal. Bladder: Decompressed by Foley catheter. Bladder wall appears thickened but this may be accentuated by the decompression. IMPRESSION: 1. Bilateral hydronephrosis, at least moderate in degree bilaterally. Consider CT for further characterization. 2. Bladder decompressed by Foley catheter. Questionable bladder wall thickening. Electronically Signed   By: Franki Cabot M.D.   On: 09/28/2016 16:37    Procedures Procedures (including critical care time)  Medications Ordered in ED Medications - No data to display   Initial Impression / Assessment and Plan / ED Course  I have reviewed the triage vital signs and the nursing notes.  Pertinent labs & imaging results that were available during my care of the patient were reviewed by me and considered in my medical decision making (see chart for details).  Iv ns. Labs.   Reviewed nursing notes and prior charts for additional history.   Continuous pulse ox and monitor, ecg.  Patient w severe AKI with creatinine above 24. Previously 1.5.  On bladder scan 900 cc urine in bladder. ?obstructive uropathy. Foley.  Iv ns bolus.   k high. Ca gluc iv. ecg.   Post foley placement 1200 cc urine in bag.   Medical service consulted for admission.  CRITICAL CARE  RE acute kidney injury with hyperkalemia requiring emergent iv therapy, weakness, failure to thrive.  Performed by: Mirna Mires Total critical care time: 35 minutes Critical care time was exclusive of separately billable procedures and treating other patients. Critical care was necessary to treat or prevent imminent or life-threatening deterioration. Critical care was time spent personally by me on the  following activities: development of treatment plan with patient and/or surrogate as well as nursing, discussions with consultants, evaluation of patient's response to treatment, examination of patient, obtaining history from patient or surrogate, ordering and performing treatments and interventions, ordering and review of laboratory studies, ordering and review of radiographic studies, pulse oximetry and re-evaluation of patient's condition.   Final Clinical Impressions(s) / ED Diagnoses   Final diagnoses:  None    New Prescriptions New Prescriptions   No medications on file     Lajean Saver, MD 09/30/16 [phone]

## 2016-09-28 ENCOUNTER — Inpatient Hospital Stay (HOSPITAL_COMMUNITY): Payer: Medicare Other

## 2016-09-28 DIAGNOSIS — N3000 Acute cystitis without hematuria: Secondary | ICD-10-CM

## 2016-09-28 DIAGNOSIS — N179 Acute kidney failure, unspecified: Principal | ICD-10-CM

## 2016-09-28 DIAGNOSIS — E875 Hyperkalemia: Secondary | ICD-10-CM

## 2016-09-28 DIAGNOSIS — N139 Obstructive and reflux uropathy, unspecified: Secondary | ICD-10-CM

## 2016-09-28 LAB — COMPREHENSIVE METABOLIC PANEL
ALK PHOS: 44 U/L (ref 38–126)
ALT: 22 U/L (ref 17–63)
AST: 25 U/L (ref 15–41)
Albumin: 2.8 g/dL — ABNORMAL LOW (ref 3.5–5.0)
Anion gap: 17 — ABNORMAL HIGH (ref 5–15)
BILIRUBIN TOTAL: 1.1 mg/dL (ref 0.3–1.2)
BUN: 116 mg/dL — ABNORMAL HIGH (ref 6–20)
CALCIUM: 9 mg/dL (ref 8.9–10.3)
CO2: 19 mmol/L — AB (ref 22–32)
Chloride: 99 mmol/L — ABNORMAL LOW (ref 101–111)
Creatinine, Ser: 10.48 mg/dL — ABNORMAL HIGH (ref 0.61–1.24)
GFR calc non Af Amer: 4 mL/min — ABNORMAL LOW (ref >60)
GFR, EST AFRICAN AMERICAN: 4 mL/min — AB (ref >60)
Glucose, Bld: 153 mg/dL — ABNORMAL HIGH (ref 65–99)
POTASSIUM: 3.5 mmol/L (ref 3.5–5.1)
SODIUM: 135 mmol/L (ref 135–145)
TOTAL PROTEIN: 5.9 g/dL — AB (ref 6.5–8.1)

## 2016-09-28 LAB — CBC
HEMATOCRIT: 30.2 % — AB (ref 39.0–52.0)
HEMOGLOBIN: 10.6 g/dL — AB (ref 13.0–17.0)
MCH: 29.9 pg (ref 26.0–34.0)
MCHC: 35.1 g/dL (ref 30.0–36.0)
MCV: 85.1 fL (ref 78.0–100.0)
Platelets: 166 10*3/uL (ref 150–400)
RBC: 3.55 MIL/uL — AB (ref 4.22–5.81)
RDW: 12.8 % (ref 11.5–15.5)
WBC: 9.8 10*3/uL (ref 4.0–10.5)

## 2016-09-28 LAB — MRSA PCR SCREENING: MRSA BY PCR: NEGATIVE

## 2016-09-28 NOTE — Progress Notes (Signed)
PROGRESS NOTE [facility]   Alexander Rosales   DXA:[id number] DOB: 26-Sep-1929  DOA: 09/27/2016 PCP: Kristine Garbe, MD   Brief Narrative:  Alexander Rosales is a 83 y.o. male with medical history significant of hypertension, BPH, hyperlipidemia, blind, deaf, mute, is being brought from his skilled nursing facility for failure to thrive. Patient was found to have a creatinine of 24.6, BUN of 176, K of 6.4 and bicarbonate 16 and severely distended bladder. Foley catheter was placed yelling about 1.2 L of urine. Patient was admitted for post obstructive acute kidney failure, and UA grossly abnormal   Subjective: Patient is deaf, blind, mute unable to give history. Response to tactile stimuli.  Assessment & Plan: Acute renal failure on chronic kidney disease stage III - creatinine down to 10.4, good urine output Unknown baseline creatinine appears to have CKD stage III Most recent creatinine was done in February 2018 and was 1.5.  He was in the ED at that time with a syncopal episode Will remove Foley and perform bladder scan in 4 hours if urine more than 400 place Foley back and consult urology. Continue performing bladder every 4 hours if no urine output Case was discussed with nephrology recommending renal ultrasound which showed bilateral hydronephrosis. Repeat BMP in the morning - expect creatinine 2 trend down farther  Hypertension - BP stable Continue home medications  Deaf mutism, blindness, Patient noninteractive however will drink fluids if nurse brings it to him  Hyperkalemia - resolve  Treated with Kayexalate in the ED Continue to monitor  BPH  In setting of acute renal failure, Foley was place in the ED Now foley has been removed we'll continue to monitor for urine output Cont Flomax daily  UTI - UA grossly abnormal He unable to verbalize symptoms, but given mild microcytosis and UA results was started on empiric antibiotic. Will continue Rocephin for now. Follow-up  urine cultures   DVT prophylaxis: Heparin Code Status: DO NOT RESUSCITATE Family Communication: (None at bedside Disposition Plan: Back to SNF when medically stable  Consultants:   None   Procedures:  None  Antimicrobials:  Rocephin started on 09/27/2016   Objective: Vitals:   09/27/16 1946 09/27/16 2050 09/28/16 0533 09/28/16 1405  BP: 139/70 (!) 158/78 (!) 169/74 137/82  Pulse: 84 88 (!) 110 97  Resp: 12 18 19 18   Temp:  97.7 F (36.5 C) 97.6 F (36.4 C) 98.7 F (37.1 C)  TempSrc:  Axillary Oral Oral  SpO2: 99% 100% 100% 100%  Weight:  53.7 kg (118 lb 6.2 oz)    Height:  5\' 1"  (1.549 m)      Intake/Output Summary (Last 24 hours) at 09/28/16 1841 Last data filed at 09/28/16 1751  Gross per 24 hour  Intake          2328.34 ml  Output             9450 ml  Net         -7121.66 ml   Filed Weights   09/27/16 2050  Weight: 53.7 kg (118 lb 6.2 oz)    Examination:  General exam: Appears calm and comfortable  Respiratory system: Clear to auscultation. No wheezes,crackle or rhonchi Cardiovascular system: S1 & S2 heard, RRR. No JVD, murmurs, rubs or gallops Gastrointestinal system: Abdomen is nondistended, soft and nontender. +BS Extremities: No pedal edema.  Skin: No rashes, lesions or ulcers Psychiatry: Nonverbal   Data Reviewed: I have personally reviewed following labs and imaging studies  CBC:  Recent Labs Lab 09/27/16 1445 09/28/16 0526  WBC 13.2* 9.8  HGB 11.7* 10.6*  HCT 33.3* 30.2*  MCV 84.7 85.1  PLT 185 696   Basic Metabolic Panel:  Recent Labs Lab 09/27/16 1445 09/28/16 0526  NA 130* 135  K 6.4* 3.5  CL 89* 99*  CO2 16* 19*  GLUCOSE 116* 153*  BUN 176* 116*  CREATININE 24.64* 10.48*  CALCIUM 8.8* 9.0   GFR: Estimated Creatinine Clearance: 3.8 mL/min (A) (by C-G formula based on SCr of 10.48 mg/dL (H)). Liver Function Tests:  Recent Labs Lab 09/27/16 1445 09/28/16 0526  AST 25 25  ALT 23 22  ALKPHOS 55 44  BILITOT  0.6 1.1  PROT 6.9 5.9*  ALBUMIN 3.5 2.8*   No results for input(s): LIPASE, AMYLASE in the last 168 hours. No results for input(s): AMMONIA in the last 168 hours. Coagulation Profile: No results for input(s): INR, PROTIME in the last 168 hours. Cardiac Enzymes: No results for input(s): CKTOTAL, CKMB, CKMBINDEX, TROPONINI in the last 168 hours. BNP (last 3 results) No results for input(s): PROBNP in the last 8760 hours. HbA1C: No results for input(s): HGBA1C in the last 72 hours. CBG: No results for input(s): GLUCAP in the last 168 hours. Lipid Profile: No results for input(s): CHOL, HDL, LDLCALC, TRIG, CHOLHDL, LDLDIRECT in the last 72 hours. Thyroid Function Tests: No results for input(s): TSH, T4TOTAL, FREET4, T3FREE, THYROIDAB in the last 72 hours. Anemia Panel: No results for input(s): VITAMINB12, FOLATE, FERRITIN, TIBC, IRON, RETICCTPCT in the last 72 hours. Sepsis Labs: No results for input(s): PROCALCITON, LATICACIDVEN in the last 168 hours.  Recent Results (from the past 240 hour(s))  MRSA PCR Screening     Status: None   Collection Time: 09/27/16  9:13 PM  Result Value Ref Range Status   MRSA by PCR NEGATIVE NEGATIVE Final    Comment:        The GeneXpert MRSA Assay (FDA approved for NASAL specimens only), is one component of a comprehensive MRSA colonization surveillance program. It is not intended to diagnose MRSA infection nor to guide or monitor treatment for MRSA infections.       Radiology Studies: US Renal  Result Date: 09/28/2016 CLINICAL DATA:  Acute renal injury, is elevated BUN and creatinine. EXAM: RENAL / URINARY TRACT ULTRASOUND COMPLETE COMPARISON:  None. FINDINGS: Right Kidney: Length: 10.3 cm. Moderate hydronephrosis. Cortical thickness and echogenicity is grossly normal. Left Kidney: Length: 9.3 cm. Moderate hydronephrosis. Cortical thickness and echogenicity is grossly normal. Bladder: Decompressed by Foley catheter. Bladder wall appears  thickened but this may be accentuated by the decompression. IMPRESSION: 1. Bilateral hydronephrosis, at least moderate in degree bilaterally. Consider CT for further characterization. 2. Bladder decompressed by Foley catheter. Questionable bladder wall thickening. Electronically Signed   By: Franki Cabot M.D.   On: 09/28/2016 16:37      Scheduled Meds: . cefTRIAXone (ROCEPHIN)  IV  1 g Intravenous Q24H  . heparin  5,000 Units Subcutaneous Q8H  . hydrALAZINE  25 mg Oral BID  . sodium chloride flush  3 mL Intravenous Q12H  . tamsulosin  0.4 mg Oral Daily   Continuous Infusions: . dextrose 5% lactated ringers 100 mL/hr at 09/28/16 1727     LOS: 1 day     Chipper Oman, MD Pager: Text Page via [email or web address]  [phone]  If 7PM-7AM, please contact night-coverage [email or web address] Password [facility] 09/28/2016, 6:41 PM

## 2016-09-29 ENCOUNTER — Encounter (HOSPITAL_COMMUNITY): Payer: Self-pay

## 2016-09-29 DIAGNOSIS — N401 Enlarged prostate with lower urinary tract symptoms: Secondary | ICD-10-CM

## 2016-09-29 DIAGNOSIS — N138 Other obstructive and reflux uropathy: Secondary | ICD-10-CM

## 2016-09-29 DIAGNOSIS — R627 Adult failure to thrive: Secondary | ICD-10-CM

## 2016-09-29 LAB — CBC WITH DIFFERENTIAL/PLATELET
BASOS ABS: 0 10*3/uL (ref 0.0–0.1)
Basophils Relative: 0 %
Eosinophils Absolute: 0.1 10*3/uL (ref 0.0–0.7)
Eosinophils Relative: 1 %
HEMATOCRIT: 31.3 % — AB (ref 39.0–52.0)
Hemoglobin: 10.7 g/dL — ABNORMAL LOW (ref 13.0–17.0)
LYMPHS ABS: 1.2 10*3/uL (ref 0.7–4.0)
LYMPHS PCT: 10 %
MCH: 29.9 pg (ref 26.0–34.0)
MCHC: 34.2 g/dL (ref 30.0–36.0)
MCV: 87.4 fL (ref 78.0–100.0)
MONO ABS: 1.3 10*3/uL — AB (ref 0.1–1.0)
MONOS PCT: 11 %
NEUTROS ABS: 9.5 10*3/uL — AB (ref 1.7–7.7)
Neutrophils Relative %: 78 %
Platelets: 155 10*3/uL (ref 150–400)
RBC: 3.58 MIL/uL — ABNORMAL LOW (ref 4.22–5.81)
RDW: 12.7 % (ref 11.5–15.5)
WBC: 12.1 10*3/uL — ABNORMAL HIGH (ref 4.0–10.5)

## 2016-09-29 LAB — URINE CULTURE: Culture: NO GROWTH

## 2016-09-29 LAB — COMPREHENSIVE METABOLIC PANEL
ALT: 22 U/L (ref 17–63)
AST: 27 U/L (ref 15–41)
Albumin: 2.9 g/dL — ABNORMAL LOW (ref 3.5–5.0)
Alkaline Phosphatase: 44 U/L (ref 38–126)
Anion gap: 8 (ref 5–15)
BILIRUBIN TOTAL: 1 mg/dL (ref 0.3–1.2)
BUN: 50 mg/dL — AB (ref 6–20)
CO2: 28 mmol/L (ref 22–32)
CREATININE: 2.44 mg/dL — AB (ref 0.61–1.24)
Calcium: 8.9 mg/dL (ref 8.9–10.3)
Chloride: 103 mmol/L (ref 101–111)
GFR, EST AFRICAN AMERICAN: 26 mL/min — AB (ref >60)
GFR, EST NON AFRICAN AMERICAN: 23 mL/min — AB (ref >60)
Glucose, Bld: 107 mg/dL — ABNORMAL HIGH (ref 65–99)
POTASSIUM: 3.1 mmol/L — AB (ref 3.5–5.1)
Sodium: 139 mmol/L (ref 135–145)
TOTAL PROTEIN: 6 g/dL — AB (ref 6.5–8.1)

## 2016-09-29 MED ORDER — SODIUM CHLORIDE 0.9 % IV SOLN
Freq: Once | INTRAVENOUS | Status: AC
  Administered 2016-09-29: 09:00:00 via INTRAVENOUS
  Filled 2016-09-29: qty 1000

## 2016-09-29 NOTE — Clinical Social Work Note (Signed)
Clinical Social Work Assessment  Patient Details  Name: Alexander Rosales MRN: [id number] Date of Birth: 1929/10/04  Date of referral:  09/29/16               Reason for consult:  Facility Placement                Permission sought to share information with:  Facility Art therapist granted to share information::  No, pt is noncommunicative  Name::      Ecologist::     Relationship::   niece, Administrator, arts Information:     Housing/Transportation Living arrangements for the past 2 months:  [place] of Information:  Other (Comment Required) (niece Estate manager/land agent) Patient Interpreter Needed:  None Criminal Activity/Legal Involvement Pertinent to Current Situation/Hospitalization:  No - Comment as needed Significant Relationships:  Siblings, Other Family Members Lives with:  Facility Resident Do you feel safe going back to the place where you live?  No Need for family participation in patient care:  Yes (Comment) (decision making)  Care giving concerns:  Pt was living at ALF and slowly declining over past 3 weeks- family had some concerns about care giving capabilities at ALF.  Now pt almost total care and not appropriate to return to ALF.   Social Worker assessment / plan:  CSW spoke with pt niece/HCPOA regarding pt care plan for pt.  CSW confirmed pt was resident at [facility] states pt has gotten much weaker recently at ALF and agrees they aren't capable of taking care of him with his current care needs.  CSW explained SNF recommendation and referral process.  Alexander Rosales states her mom recently at [facility] and understands the process.  Employment status:  Retired Forensic scientist:  Medicare PT Recommendations:  [place] / Referral to community resources:  [place]  Patient/Family's Response to care:  Alexander Rosales agreeable to SNF placement due to pt drastic change in care needs.   Patient/Family's  Understanding of and Emotional Response to Diagnosis, Current Treatment, and Prognosis:  Alexander Rosales has good understanding of pt condition and is realistic about pt needs at this time- hopeful pt can return to baseline with rehab stay.  Emotional Assessment Appearance:  Appears stated age Attitude/Demeanor/Rapport:  Unable to Assess Affect (typically observed):  Unable to Assess Orientation:    Alcohol / Substance use:  Not Applicable Psych involvement (Current and /or in the community):  No (Comment)  Discharge Needs  Concerns to be addressed:  Care Coordination Readmission within the last 30 days:    Current discharge risk:  Physical Impairment Barriers to Discharge:  Continued Medical Work up   Jorge Ny, LCSW 09/29/2016, 3:55 PM

## 2016-09-29 NOTE — Treatment Plan (Addendum)
Urology called regarding this patient's current admission on hospitalist service for AKI (Cr 24 on admission), AUR with 2 failed trial of voids requiring Foley catheterization. Renal U/S shows bilateral mod hydro. Known history of elevated PSA and BPH on flomax in patient that was previously conservatively treated in setting of elevated PSA due to his medical status (blind, deaf, mute, DNR). Last seen 2009 with Dr. Diona Fanti of [facility].  Vitals stable. Cr now with Foley in place 2.44 (from 10.48 on 4/7, and 24.64 on admission 4/6). WBC 13 on admission with infected appearing urinalysis.   Recommend continuing Foley now and on discharge. He will need Foley catheter for at least 7 days based on this hospitalization, UOP on placement (1.5 on first placement, 700cc on 2nd placement per report), and history of BPH. Continue Flomax. Antibiotics per medicine.   We will send note to [facility] scheduling to have patient set up in approximately 1 week for visit with Dr. Diona Fanti and/or NP/PA for outpatient visit for [place]. Based on this event and history may require chronic bladder outlet management with Foley or alternative, however this can be discussed with family in outpatient setting.

## 2016-09-29 NOTE — Progress Notes (Signed)
PROGRESS NOTE [facility]   Alexander Rosales   WJX:[id number] DOB: [birth year]  DOA: 09/27/2016 PCP: Kristine Garbe, MD   Brief Narrative:  Alexander Rosales is a 83 y.o. male with medical history significant of hypertension, BPH, hyperlipidemia, blind, deaf, mute, is being brought from his skilled nursing facility for failure to thrive. Patient was found to have a creatinine of 24.6, BUN of 176, K of 6.4 and bicarbonate 16 and severely distended bladder. Foley catheter was placed yield about 1.2 L of urine. Patient was admitted for post obstructive acute kidney failure, and UA grossly abnormal   Subjective: Patient blind deaf and mute unable to provide information. Patient very responsive to tactile stimuli. Reacting continues to improve. Foley was placed back over night due to failing urine trials  Assessment & Plan: Post obstructive acute renal failure on chronic kidney disease stage III - creatinine down to 2.4 today  good urine output. Secondary to BPH Initial creatinine of 24.6 Unknown baseline creatinine appears to have CKD stage III Most recent creatinine was done in February 2018 and was 1.5. He was in the ED at that time with a syncopal episode Case was discussed with nephrology recommending renal ultrasound which showed bilateral hydronephrosis. Check BMP in Am   BPH  In setting of acute renal failure, Foley was place in the ED yield about 1.2L  Foley was removed, but failed to urinate, retained about 700 cc.  Case discussed with Urology, whom recommending to keep foley for 7 day and follow up with Urology as outpatient. PSA perviously elevated.  Cont Flomax daily  Hypertension - BP stable Continue home medications  Deaf mutism, blindness, Patient very interactive with nurse, eating and drinking well.  Hyperkalemia - resolve  Treated with Kayexalate in the ED Continue to monitor  UTI - UA grossly abnormal He unable to verbalize symptoms, but given mild microcytosis  and UA results was started on empiric antibiotic.  Initially treated with rocephin urine cultures shows no growth. Will give 1 more dose of abx for completion    DVT prophylaxis: Heparin Code Status: DO NOT RESUSCITATE Family Communication: Case discussed with niece via phone  Disposition Plan: SNF in AM, patient will follow up with urology as outpatient   Consultants:   None   Procedures:  None  Antimicrobials:  Rocephin started on 09/27/2016   Objective: Vitals:   09/28/16 1405 09/28/16 2227 09/29/16 0451 09/29/16 1407  BP: 137/82 132/86 130/76 (!) 155/95  Pulse: 97 92 90 73  Resp: 18 18 18 18   Temp: 98.7 F (37.1 C) 98.2 F (36.8 C) 98.3 F (36.8 C) 99.1 F (37.3 C)  TempSrc: Oral Oral Oral Oral  SpO2: 100% 98% 97% 98%  Weight:      Height:        Intake/Output Summary (Last 24 hours) at 09/29/16 1709 Last data filed at 09/29/16 1411  Gross per 24 hour  Intake          1326.67 ml  Output             2250 ml  Net          -923.33 ml   Filed Weights   09/27/16 2050  Weight: 53.7 kg (118 lb 6.2 oz)    Examination:  General exam: NAD Respiratory system: CTA  Cardiovascular system: S1S2 no murmurs Gastrointestinal system: Soft Non distended  Extremities: No LE edema Skin: No lesions  Psychiatry: Nonverbal   Data Reviewed: I have personally reviewed following labs  and imaging studies  CBC:  Recent Labs Lab 09/27/16 1445 09/28/16 0526 09/29/16 0507  WBC 13.2* 9.8 12.1*  NEUTROABS  --   --  9.5*  HGB 11.7* 10.6* 10.7*  HCT 33.3* 30.2* 31.3*  MCV 84.7 85.1 87.4  PLT 185 166 174   Basic Metabolic Panel:  Recent Labs Lab 09/27/16 1445 09/28/16 0526 09/29/16 0507  NA 130* 135 139  K 6.4* 3.5 3.1*  CL 89* 99* 103  CO2 16* 19* 28  GLUCOSE 116* 153* 107*  BUN 176* 116* 50*  CREATININE 24.64* 10.48* 2.44*  CALCIUM 8.8* 9.0 8.9   GFR: Estimated Creatinine Clearance: 16.4 mL/min (A) (by C-G formula based on SCr of 2.44 mg/dL  (H)). Liver Function Tests:  Recent Labs Lab 09/27/16 1445 09/28/16 0526 09/29/16 0507  AST 25 25 27   ALT 23 22 22   ALKPHOS 55 44 44  BILITOT 0.6 1.1 1.0  PROT 6.9 5.9* 6.0*  ALBUMIN 3.5 2.8* 2.9*   No results for input(s): LIPASE, AMYLASE in the last 168 hours. No results for input(s): AMMONIA in the last 168 hours. Coagulation Profile: No results for input(s): INR, PROTIME in the last 168 hours. Cardiac Enzymes: No results for input(s): CKTOTAL, CKMB, CKMBINDEX, TROPONINI in the last 168 hours. BNP (last 3 results) No results for input(s): PROBNP in the last 8760 hours. HbA1C: No results for input(s): HGBA1C in the last 72 hours. CBG: No results for input(s): GLUCAP in the last 168 hours. Lipid Profile: No results for input(s): CHOL, HDL, LDLCALC, TRIG, CHOLHDL, LDLDIRECT in the last 72 hours. Thyroid Function Tests: No results for input(s): TSH, T4TOTAL, FREET4, T3FREE, THYROIDAB in the last 72 hours. Anemia Panel: No results for input(s): VITAMINB12, FOLATE, FERRITIN, TIBC, IRON, RETICCTPCT in the last 72 hours. Sepsis Labs: No results for input(s): PROCALCITON, LATICACIDVEN in the last 168 hours.  Recent Results (from the past 240 hour(s))  Culture, Urine     Status: None   Collection Time: 09/27/16  4:54 PM  Result Value Ref Range Status   Specimen Description URINE, RANDOM  Final   Special Requests NONE  Final   Culture   Final    NO GROWTH Performed at [facility], [address] [place], [place] [address]    Report Status 09/29/2016 FINAL  Final  MRSA PCR Screening     Status: None   Collection Time: 09/27/16  9:13 PM  Result Value Ref Range Status   MRSA by PCR NEGATIVE NEGATIVE Final    Comment:        The GeneXpert MRSA Assay (FDA approved for NASAL specimens only), is one component of a comprehensive MRSA colonization surveillance program. It is not intended to diagnose MRSA infection nor to guide or monitor treatment for MRSA  infections.       Radiology Studies: US Renal  Result Date: 09/28/2016 CLINICAL DATA:  Acute renal injury, is elevated BUN and creatinine. EXAM: RENAL / URINARY TRACT ULTRASOUND COMPLETE COMPARISON:  None. FINDINGS: Right Kidney: Length: 10.3 cm. Moderate hydronephrosis. Cortical thickness and echogenicity is grossly normal. Left Kidney: Length: 9.3 cm. Moderate hydronephrosis. Cortical thickness and echogenicity is grossly normal. Bladder: Decompressed by Foley catheter. Bladder wall appears thickened but this may be accentuated by the decompression. IMPRESSION: 1. Bilateral hydronephrosis, at least moderate in degree bilaterally. Consider CT for further characterization. 2. Bladder decompressed by Foley catheter. Questionable bladder wall thickening. Electronically Signed   By: Franki Cabot M.D.   On: 09/28/2016 16:37  Scheduled Meds: . cefTRIAXone (ROCEPHIN)  IV  1 g Intravenous Q24H  . heparin  5,000 Units Subcutaneous Q8H  . hydrALAZINE  25 mg Oral BID  . sodium chloride flush  3 mL Intravenous Q12H  . tamsulosin  0.4 mg Oral Daily   Continuous Infusions:    LOS: 2 days    Alexander Oman, MD Pager: Text Page via [email or web address]  [phone]  If 7PM-7AM, please contact night-coverage [email or web address] Password [email or web address] 09/29/2016, 5:09 PM

## 2016-09-29 NOTE — NC FL2 (Signed)
[place] MEDICAID FL2 LEVEL OF CARE SCREENING TOOL     IDENTIFICATION  Patient Name: Alexander Rosales Birthdate: March 09, 1930 Sex: male Admission Date (Current Location): 09/27/2016  [facility] and [place] Number:  Herbalist and Address:  [facility], [place] [address] [place], [place] [address]      Provider Number: [id number]  Attending Physician Name and Address:  Doreatha Lew, MD  Relative Name and Phone Number:       Current Level of Care: Hospital Recommended Level of Care: [place] Prior Approval Number:    Date Approved/Denied:   PASRR Number:    Discharge Plan: SNF    Current Diagnoses: Patient Active Problem List   Diagnosis Date Noted  . Hyperkalemia 09/27/2016  . Acute renal failure ([place]) 09/19/2011  . Frequency of urination 08/20/2011  . Hypokalemia 08/20/2011  . Back pain 08/20/2011  . CHEST PAIN UNSPECIFIED 04/06/2009  . RECTAL BLEEDING 11/09/2007  . HEMATURIA UNSPECIFIED 11/09/2007  . FECAL OCCULT BLOOD 11/09/2007  . NEOPLASM, MALIGNANT, KIDNEY 02/07/2007  . STATUS, MENTAL, ALTERED 01/30/2007  . HYPERCHOLESTEROLEMIA 04/10/2006  . Blindness of both eyes 04/10/2006  . Deaf mutism, acquired 04/10/2006  . HYPERTENSION 04/10/2006  . CAD 04/10/2006  . GERD 04/10/2006  . CHOLEDOCHOLITHIASIS 04/10/2006  . DIARRHEA 04/10/2006  . CHOLECYSTECTOMY, HX OF 04/10/2006    Orientation RESPIRATION BLADDER Height & Weight      (DOx4)  Normal Incontinent, Indwelling catheter Weight: 118 lb 6.2 oz (53.7 kg) Height:  5\' 1"  (154.9 cm)  BEHAVIORAL SYMPTOMS/MOOD NEUROLOGICAL BOWEL NUTRITION STATUS      Incontinent Diet (see DC summary)  AMBULATORY STATUS COMMUNICATION OF NEEDS Skin   Extensive Assist Does not communicate Normal                       Personal Care Assistance Level of Assistance  Bathing, Dressing, Feeding Bathing Assistance: Maximum assistance Feeding assistance: Limited assistance Dressing  Assistance: Maximum assistance     Functional Limitations Info  Sight, Hearing, Speech Sight Info: Impaired Hearing Info: Impaired Speech Info: Impaired    SPECIAL CARE FACTORS FREQUENCY  PT (By licensed PT), OT (By licensed OT)     PT Frequency: 5/wk OT Frequency: 5/wk            Contractures      Additional Factors Info  Code Status, Allergies Code Status Info: DNR Allergies Info: Aspirin           Current Medications (09/29/2016):  This is the current hospital active medication list Current Facility-Administered Medications  Medication Dose Route Frequency Provider Last Rate Last Dose  . cefTRIAXone (ROCEPHIN) 1 g in dextrose 5 % 50 mL IVPB  1 g Intravenous Q24H Caren Griffins, MD 100 mL/hr at 09/28/16 1728 1 g at 09/28/16 1728  . heparin injection 5,000 Units  5,000 Units Subcutaneous Q8H Costin Karlyne Greenspan, MD   5,000 Units at 09/29/16 1310  . hydrALAZINE (APRESOLINE) tablet 25 mg  25 mg Oral BID Caren Griffins, MD   25 mg at 09/29/16 4268  . sodium chloride flush (NS) 0.9 % injection 3 mL  3 mL Intravenous Q12H Caren Griffins, MD   3 mL at 09/29/16 0923  . tamsulosin (FLOMAX) capsule 0.4 mg  0.4 mg Oral Daily Caren Griffins, MD   0.4 mg at 09/29/16 3419     Discharge Medications: Please see discharge summary for a list of discharge medications.  Relevant  Imaging Results:  Relevant Lab Results:   Additional Information SS#: [id number]  Jorge Ny, LCSW

## 2016-09-29 NOTE — Evaluation (Signed)
Physical Therapy Evaluation Patient Details Name: Alexander Rosales MRN: [id number] DOB: [birth year] Today's Date: 09/29/2016   History of Present Illness  83 y.o. male with medical history significant of hypertension, BPH, hyperlipidemia, blind, deaf, mute, is being brought from his assisted living facility for failure to thrive, found to have Acute renal failure on chronic kidney disease stage III   Clinical Impression  Pt admitted with above diagnosis. Pt currently with functional limitations due to the deficits listed below (see PT Problem List).  Pt will benefit from skilled PT to increase their independence and safety with mobility to allow discharge to the venue listed below.  Family present upon entering room and report pt is from ALF and was ambulatory approx 3 weeks ago.  Family left room for session.  Pt responds well to manual cues and hand placement so assisted pt OOB to recliner.  Pt would benefit from SNF upon d/c.     Follow Up Recommendations SNF;Supervision/Assistance - 24 hour    Equipment Recommendations  Wheelchair cushion (measurements PT);Wheelchair (measurements PT);Hospital bed    Recommendations for Other Services       Precautions / Restrictions Precautions Precautions: Fall Precaution Comments: pt is deaf, mute, blind      Mobility  Bed Mobility Overal bed mobility: Needs Assistance Bed Mobility: Sidelying to Sit     Supine to sit: Total assist     General bed mobility comments: pt required total assist for rolling and pericare as well as to sit upright  Transfers Overall transfer level: Needs assistance Equipment used: Rolling walker (2 wheeled) Transfers: Sit to/from [facility] Sit to Stand: Mod assist;+2 physical assistance Stand pivot transfers: Mod assist;+2 physical assistance       General transfer comment: placed pt's hand on recliner armrest and then onto walker, pt given rise cues under arm and assisted with standing,  guided RW and hips for pivoting to recliner  Ambulation/Gait                Stairs            Wheelchair Mobility    Modified Rankin (Stroke Patients Only)       Balance Overall balance assessment: Needs assistance Sitting-balance support: Feet supported;No upper extremity supported Sitting balance-Leahy Scale: Fair Sitting balance - Comments: pt able to sit EOB without UE support, min/guard for safety, drank his cranberry juice   Standing balance support: Bilateral upper extremity supported;During functional activity Standing balance-Leahy Scale: Zero Standing balance comment: required UE assist and increased external support                             Pertinent Vitals/Pain Pain Assessment: Faces Faces Pain Scale: Hurts whole lot Pain Location: grimaces when moving foley catheter for mobility Pain Descriptors / Indicators: Grimacing Pain Intervention(s): Monitored during session;Limited activity within patient's tolerance;Repositioned    Home Living Family/patient expects to be discharged to:: Assisted living                      Prior Function Level of Independence: Needs assistance   Gait / Transfers Assistance Needed: family reports pt was ambulatory 3 weeks ago, state he lives in ALF           Hand Dominance        Extremity/Trunk Assessment   Upper Extremity Assessment Upper Extremity Assessment: Generalized weakness    Lower Extremity Assessment Lower Extremity Assessment: Generalized weakness  Communication   Communication: Deaf (mute, blind)  Cognition Arousal/Alertness: Awake/alert   Overall Cognitive Status: Difficult to assess                                 General Comments: pt with impaired communication however responds well to manual cues      General Comments      Exercises     Assessment/Plan    PT Assessment Patient needs continued PT services  PT Problem List Decreased  strength;Decreased mobility;Decreased activity tolerance;Decreased knowledge of use of DME;Decreased balance       PT Treatment Interventions DME instruction;Gait training;Functional mobility training;Therapeutic activities;Patient/family education;Therapeutic exercise;Balance training    PT Goals (Current goals can be found in the Care Plan section)  Acute Rehab PT Goals PT Goal Formulation: Patient unable to participate in goal setting Time For Goal Achievement: 10/13/16 Potential to Achieve Goals: Fair    Frequency Min 3X/week   Barriers to discharge        Co-evaluation               End of Session Equipment Utilized During Treatment: Gait belt Activity Tolerance: Patient tolerated treatment well Patient left: in chair;with chair alarm set;with call bell/phone within reach Nurse Communication: Mobility status PT Visit Diagnosis: Difficulty in walking, not elsewhere classified (R26.2);Muscle weakness (generalized) (M62.81)    Time: 7622-6333 PT Time Calculation (min) (ACUTE ONLY): 23 min   Charges:   PT Evaluation $PT Eval Moderate Complexity: 1 Procedure     PT G Codes:       Carmelia Bake, PT, DPT 09/29/2016 Pager: [phone]   York Ram E 09/29/2016, 3:17 PM

## 2016-09-29 NOTE — Progress Notes (Signed)
Patient with loose stool that is rust/blood tinged, bleeding noted at foley cath insertion site, paged MD to make aware

## 2016-09-30 ENCOUNTER — Encounter (HOSPITAL_COMMUNITY): Payer: Self-pay

## 2016-09-30 LAB — COMPREHENSIVE METABOLIC PANEL
ALT: 21 U/L (ref 17–63)
ANION GAP: 8 (ref 5–15)
AST: 33 U/L (ref 15–41)
Albumin: 2.8 g/dL — ABNORMAL LOW (ref 3.5–5.0)
Alkaline Phosphatase: 45 U/L (ref 38–126)
BUN: 17 mg/dL (ref 6–20)
CHLORIDE: 102 mmol/L (ref 101–111)
CO2: 23 mmol/L (ref 22–32)
CREATININE: 1.24 mg/dL (ref 0.61–1.24)
Calcium: 8.5 mg/dL — ABNORMAL LOW (ref 8.9–10.3)
GFR, EST AFRICAN AMERICAN: 59 mL/min — AB (ref >60)
GFR, EST NON AFRICAN AMERICAN: 51 mL/min — AB (ref >60)
Glucose, Bld: 168 mg/dL — ABNORMAL HIGH (ref 65–99)
POTASSIUM: 4.2 mmol/L (ref 3.5–5.1)
Sodium: 133 mmol/L — ABNORMAL LOW (ref 135–145)
Total Bilirubin: 0.6 mg/dL (ref 0.3–1.2)
Total Protein: 6.1 g/dL — ABNORMAL LOW (ref 6.5–8.1)

## 2016-09-30 LAB — CBC
HCT: 30.4 % — ABNORMAL LOW (ref 39.0–52.0)
Hemoglobin: 10.5 g/dL — ABNORMAL LOW (ref 13.0–17.0)
MCH: 30.5 pg (ref 26.0–34.0)
MCHC: 34.5 g/dL (ref 30.0–36.0)
MCV: 88.4 fL (ref 78.0–100.0)
PLATELETS: 156 10*3/uL (ref 150–400)
RBC: 3.44 MIL/uL — ABNORMAL LOW (ref 4.22–5.81)
RDW: 12.7 % (ref 11.5–15.5)
WBC: 12.2 10*3/uL — ABNORMAL HIGH (ref 4.0–10.5)

## 2016-09-30 LAB — BASIC METABOLIC PANEL
ANION GAP: 6 (ref 5–15)
BUN: 19 mg/dL (ref 6–20)
CALCIUM: 8.5 mg/dL — AB (ref 8.9–10.3)
CO2: 26 mmol/L (ref 22–32)
CREATININE: 1.21 mg/dL (ref 0.61–1.24)
Chloride: 104 mmol/L (ref 101–111)
GFR calc Af Amer: >60 mL/min (ref >60)
GFR calc non Af Amer: 53 mL/min — ABNORMAL LOW (ref >60)
GLUCOSE: 97 mg/dL (ref 65–99)
Potassium: 3.7 mmol/L (ref 3.5–5.1)
Sodium: 136 mmol/L (ref 135–145)

## 2016-09-30 LAB — MAGNESIUM: MAGNESIUM: 1.3 mg/dL — AB (ref 1.7–2.4)

## 2016-09-30 MED ORDER — ENSURE ENLIVE PO LIQD
237.0000 mL | Freq: Two times a day (BID) | ORAL | Status: DC
  Administered 2016-09-30 – 2016-10-01 (×4): 237 mL via ORAL

## 2016-09-30 MED ORDER — SODIUM CHLORIDE 0.9 % IV BOLUS (SEPSIS)
500.0000 mL | Freq: Once | INTRAVENOUS | Status: AC
Start: 2016-09-30 — End: 2016-09-30
  Administered 2016-09-30: 500 mL via INTRAVENOUS

## 2016-09-30 MED ORDER — MAGNESIUM SULFATE 2 GM/50ML IV SOLN
2.0000 g | Freq: Once | INTRAVENOUS | Status: AC
Start: 2016-09-30 — End: 2016-09-30
  Administered 2016-09-30: 2 g via INTRAVENOUS
  Filled 2016-09-30: qty 50

## 2016-09-30 NOTE — Care Management Note (Signed)
Case Management Note  Patient Details  Name: Alexander Rosales MRN: [id number] Date of Birth: 1930-04-05  Subjective/Objective:  83 y/o m admitted w/Hyperkalemia. From [facility]-ALF. PT recc SNF-CSW following.                  Action/Plan:d/c SNF.   Expected Discharge Date:   (unknown)               Expected Discharge Plan:  Skilled Nursing Facility  In-House Referral:  Clinical Social Work  Discharge planning Services  CM Consult  Post Acute Care Choice:    Choice offered to:     DME Arranged:    DME Agency:     HH Arranged:    [place] Agency:     Status of Service:  In process, will continue to follow  If discussed at Long Length of Stay Meetings, dates discussed:    Additional Comments:  Dessa Phi, RN 09/30/2016, 11:34 AM

## 2016-09-30 NOTE — Progress Notes (Signed)
Pt HR 120-145. Pt had a 6 beat run of V-tach followed by a 5 beat run of V-tach. VSS. MD text paged with findings. Will continue to monitor.

## 2016-09-30 NOTE — Progress Notes (Signed)
PROGRESS NOTE [facility]   Alexander Rosales   ZOX:[id number] DOB: 1929-08-09  DOA: 09/27/2016 PCP: Kristine Garbe, MD   Brief Narrative:  Alexander Rosales a 83 y.o.malewith medical history significant of hypertension, BPH, hyperlipidemia, blind, deaf, mute, is being brought from his skilled nursing facility for failure to thrive. Patient was found to have a creatinine of 24.6, BUN of 176, K of 6.4 and bicarbonate 16 and severely distended bladder. Foley catheter was placed yield about 1.2 L of urine. Patient was admitted for post obstructive acute kidney failure, and UA grossly abnormal   Subjective: Pt blind, deaf, mute, unable to communicate or provide information. Continues to remain responsive to tactile stimuli. Foley d/c'd, bleed reported by nurse was due to trauma caused by a larger foley. No complaints of diarrhea from nurse today, will continue to monitor, CBC/BMP do not show any acute abnormalities d/t diarrhea.  Assessment & Plan:   Post obstructive acute renal failure on chronic kidney disease stage III - creatinine down to 1.21 today  good urine output. Secondary to BPH Initial creatinine of 24.6 Unknown baseline creatinine appears to have CKD stage III Most recent creatinine was done in February 2018 and was 1.5. He was in the ED at that time with a syncopal episode Case was discussed with nephrology recommending renal ultrasound which showed bilateral hydronephrosis. BMP today- GFR improving (23 to 53)  BPH  In setting of acute renal failure, Foley was place in the ED yield about 1.2L  Foley was removed, but failed to urinate, retained about 700 cc.  Case discussed with Urology, whom recommending to keep foley for 7 day and follow up with Urology as outpatient. PSA perviously elevated.  Cont Flomax daily  Hypertension - BP stable Continue home medications  Deaf mutism, blindness, Patient very interactive with nurse, eating and drinking well.  Hyperkalemia -  resolve  Treated with Kayexalate in the ED Continue to monitor  UTI - UA grossly abnormal He unable to verbalize symptoms, but given mild microcytosis and UA results was started on empiric antibiotic.  Initially treated with rocephin urine cultures shows no growth. Will give 1 more dose of abx for completion   Anemia- d/t dilution  Pt still within baseline for him Monitor outpatient  DVT prophylaxis: Heparin- d/c'd as of today Code Status: DO NOT RESUSCITATE Family Communication: Case discussed with niece via phone  Disposition Plan: SNF tomorrow- pt must remain stable off mits for 24h before d/c, patient will follow up with urology as outpatient   Consultants:   None  Procedures:   None  Antimicrobials:  Rocephin started on 09/27/2016   Objective: Vitals:   09/29/16 0451 09/29/16 1407 09/29/16 2132 09/30/16 0433  BP: 130/76 (!) 155/95 (!) 164/96 (!) 145/89  Pulse: 90 73  76  Resp: 18 18  18   Temp: 98.3 F (36.8 C) 99.1 F (37.3 C)  98.9 F (37.2 C)  TempSrc: Oral Oral  Oral  SpO2: 97% 98%  97%  Weight:      Height:        Intake/Output Summary (Last 24 hours) at 09/30/16 1341 Last data filed at 09/30/16 0200  Gross per 24 hour  Intake              650 ml  Output             2650 ml  Net            -2000 ml   [employer]  09/27/16 2050  Weight: 53.7 kg (118 lb 6.2 oz)    Examination:  General exam: NAD Respiratory system: CTAB Cardiovascular system: S1 & S2 heard, No murmurs Gastrointestinal system: Abdomen is nondistended, soft and nontender Extremities: No pedal edema. Skin: No lesions Psychiatry: Non-verbal  Data Reviewed: I have personally reviewed following labs and imaging studies  CBC:  Recent Labs Lab 09/27/16 1445 09/28/16 0526 09/29/16 0507 09/30/16 0829  WBC 13.2* 9.8 12.1* 12.2*  NEUTROABS  --   --  9.5*  --   HGB 11.7* 10.6* 10.7* 10.5*  HCT 33.3* 30.2* 31.3* 30.4*  MCV 84.7 85.1 87.4 88.4  PLT 185 166 155 505    Basic Metabolic Panel:  Recent Labs Lab 09/27/16 1445 09/28/16 0526 09/29/16 0507 09/30/16 0829  NA 130* 135 139 136  K 6.4* 3.5 3.1* 3.7  CL 89* 99* 103 104  CO2 16* 19* 28 26  GLUCOSE 116* 153* 107* 97  BUN 176* 116* 50* 19  CREATININE 24.64* 10.48* 2.44* 1.21  CALCIUM 8.8* 9.0 8.9 8.5*   GFR: Estimated Creatinine Clearance: 33 mL/min (by C-G formula based on SCr of 1.21 mg/dL). Liver Function Tests:  Recent Labs Lab 09/27/16 1445 09/28/16 0526 09/29/16 0507  AST 25 25 27   ALT 23 22 22   ALKPHOS 55 44 44  BILITOT 0.6 1.1 1.0  PROT 6.9 5.9* 6.0*  ALBUMIN 3.5 2.8* 2.9*   No results for input(s): LIPASE, AMYLASE in the last 168 hours. No results for input(s): AMMONIA in the last 168 hours. Coagulation Profile: No results for input(s): INR, PROTIME in the last 168 hours. Cardiac Enzymes: No results for input(s): CKTOTAL, CKMB, CKMBINDEX, TROPONINI in the last 168 hours. BNP (last 3 results) No results for input(s): PROBNP in the last 8760 hours. HbA1C: No results for input(s): HGBA1C in the last 72 hours. CBG: No results for input(s): GLUCAP in the last 168 hours. Lipid Profile: No results for input(s): CHOL, HDL, LDLCALC, TRIG, CHOLHDL, LDLDIRECT in the last 72 hours. Thyroid Function Tests: No results for input(s): TSH, T4TOTAL, FREET4, T3FREE, THYROIDAB in the last 72 hours. Anemia Panel: No results for input(s): VITAMINB12, FOLATE, FERRITIN, TIBC, IRON, RETICCTPCT in the last 72 hours. Sepsis Labs: No results for input(s): PROCALCITON, LATICACIDVEN in the last 168 hours.  Recent Results (from the past 240 hour(s))  Culture, Urine     Status: None   Collection Time: 09/27/16  4:54 PM  Result Value Ref Range Status   Specimen Description URINE, RANDOM  Final   Special Requests NONE  Final   Culture   Final    NO GROWTH Performed at [facility], [address], [place],  [address]    Report Status 09/29/2016 FINAL  Final  MRSA PCR  Screening     Status: None   Collection Time: 09/27/16  9:13 PM  Result Value Ref Range Status   MRSA by PCR NEGATIVE NEGATIVE Final    Comment:        The GeneXpert MRSA Assay (FDA approved for NASAL specimens only), is one component of a comprehensive MRSA colonization surveillance program. It is not intended to diagnose MRSA infection nor to guide or monitor treatment for MRSA infections.     Radiology Studies: US Renal  Result Date: 09/28/2016 CLINICAL DATA:  Acute renal injury, is elevated BUN and creatinine. EXAM: RENAL / URINARY TRACT ULTRASOUND COMPLETE COMPARISON:  None. FINDINGS: Right Kidney: Length: 10.3 cm. Moderate hydronephrosis. Cortical thickness and echogenicity is grossly normal. Left  Kidney: Length: 9.3 cm. Moderate hydronephrosis. Cortical thickness and echogenicity is grossly normal. Bladder: Decompressed by Foley catheter. Bladder wall appears thickened but this may be accentuated by the decompression. IMPRESSION: 1. Bilateral hydronephrosis, at least moderate in degree bilaterally. Consider CT for further characterization. 2. Bladder decompressed by Foley catheter. Questionable bladder wall thickening. Electronically Signed   By: Franki Cabot M.D.   On: 09/28/2016 16:37    Scheduled Meds: . cefTRIAXone (ROCEPHIN)  IV  1 g Intravenous Q24H  . feeding supplement (ENSURE ENLIVE)  237 mL Oral BID BM  . heparin  5,000 Units Subcutaneous Q8H  . hydrALAZINE  25 mg Oral BID  . sodium chloride flush  3 mL Intravenous Q12H  . tamsulosin  0.4 mg Oral Daily   Continuous Infusions:   LOS: 3 days    Posey Pronto, PA Student   Attending MD note  Patient was seen, examined,treatment plan was discussed with the PA-S Posey Pronto.  I have personally reviewed the clinical findings, lab, imaging studies and management of this patient in detail. I agree with the documentation, as recorded by the PA-S and changes to above note were in bold green  83 y/o  admitted for obstructive uropathy, AKI has resolved, going for SNF. Awaiting placement due to mittens use.  Had Run of Vtach recheck labs, Mag was low   On Exam: Gen. exam: Awake, alert, not in any distress Chest: Good air entry bilaterally, no rhonchi or rales CVS: S1-S2 regular, no murmurs  Plan  Postobstructive AKI due to enlarge BPH  AKI has resolved -patient to follow up with Urology as outpatient - keep foley in x 7 days Continue Flomax   Hypomagnesemia  Replete  Check BMP and Mg in AM   Rest as above  Chipper Oman, MD  Pager: Text Page via [email or web address]  [phone]  If 7PM-7AM, please contact night-coverage [email or web address] Password [email or web address] 09/30/2016, 1:41 PM

## 2016-09-30 NOTE — Progress Notes (Addendum)
Nutrition Brief Note  Patient identified on the Malnutrition Screening Tool (MST) Report  Wt Readings from Last 15 Encounters:  09/27/16 118 lb 6.2 oz (53.7 kg)  09/16/11 111 lb 12.8 oz (50.7 kg)  08/19/11 116 lb 11.2 oz (52.9 kg)  04/06/09 (!) 92 lb (41.7 kg)  11/09/07 (!) 87 lb 1.6 oz (39.5 kg)  02/06/07 106 lb 1.6 oz (48.1 kg)  01/30/07 105 lb 1.6 oz (47.7 kg)    Body mass index is 22.37 kg/m. Patient meets criteria for normal weight based on current BMI. Skin WDL.   Pt is blind, deaf, and mute and has hx of HTN, BPH, and hyperlipidemia. Pt admitted with hyperkalemia. K was 6.4 mmol/L on admission and is now down to 3.1 mmol/L (hypokalemia).  Current diet order is Regular. Pt consumed 100% of house CLD tray for dinner last night and for breakfast this AM had oatmeal, scrambled eggs, bacon, a bagel with cream cheese, milk, and coffee. Labs and medications reviewed.   Ensure Enlive currently ordered BID; each supplement provides 350 kcal and 20 grams of protein. No further nutrition interventions warranted at this time. If nutrition issues arise, please consult RD.     Jarome Matin, MS, RD, LDN, [facility] Inpatient Clinical Dietitian Pager # [phone] After hours/weekend pager # [phone]

## 2016-10-01 DIAGNOSIS — N390 Urinary tract infection, site not specified: Secondary | ICD-10-CM | POA: Diagnosis not present

## 2016-10-01 DIAGNOSIS — H913 Deaf nonspeaking, not elsewhere classified: Secondary | ICD-10-CM | POA: Diagnosis not present

## 2016-10-01 DIAGNOSIS — D649 Anemia, unspecified: Secondary | ICD-10-CM | POA: Diagnosis not present

## 2016-10-01 DIAGNOSIS — Z9289 Personal history of other medical treatment: Secondary | ICD-10-CM | POA: Diagnosis not present

## 2016-10-01 DIAGNOSIS — Z85528 Personal history of other malignant neoplasm of kidney: Secondary | ICD-10-CM | POA: Diagnosis not present

## 2016-10-01 DIAGNOSIS — E875 Hyperkalemia: Secondary | ICD-10-CM | POA: Diagnosis not present

## 2016-10-01 DIAGNOSIS — I10 Essential (primary) hypertension: Secondary | ICD-10-CM | POA: Diagnosis not present

## 2016-10-01 DIAGNOSIS — I129 Hypertensive chronic kidney disease with stage 1 through stage 4 chronic kidney disease, or unspecified chronic kidney disease: Secondary | ICD-10-CM | POA: Diagnosis not present

## 2016-10-01 DIAGNOSIS — N138 Other obstructive and reflux uropathy: Secondary | ICD-10-CM | POA: Diagnosis not present

## 2016-10-01 DIAGNOSIS — H47619 Cortical blindness, unspecified side of brain: Secondary | ICD-10-CM | POA: Diagnosis not present

## 2016-10-01 DIAGNOSIS — N4889 Other specified disorders of penis: Secondary | ICD-10-CM | POA: Diagnosis not present

## 2016-10-01 DIAGNOSIS — N4822 Cellulitis of corpus cavernosum and penis: Secondary | ICD-10-CM | POA: Diagnosis not present

## 2016-10-01 DIAGNOSIS — N179 Acute kidney failure, unspecified: Secondary | ICD-10-CM | POA: Diagnosis not present

## 2016-10-01 DIAGNOSIS — R627 Adult failure to thrive: Secondary | ICD-10-CM | POA: Diagnosis not present

## 2016-10-01 DIAGNOSIS — R339 Retention of urine, unspecified: Secondary | ICD-10-CM | POA: Diagnosis not present

## 2016-10-01 DIAGNOSIS — N189 Chronic kidney disease, unspecified: Secondary | ICD-10-CM | POA: Diagnosis not present

## 2016-10-01 DIAGNOSIS — R338 Other retention of urine: Secondary | ICD-10-CM | POA: Diagnosis not present

## 2016-10-01 DIAGNOSIS — N183 Chronic kidney disease, stage 3 (moderate): Secondary | ICD-10-CM | POA: Diagnosis not present

## 2016-10-01 DIAGNOSIS — N139 Obstructive and reflux uropathy, unspecified: Secondary | ICD-10-CM | POA: Diagnosis not present

## 2016-10-01 DIAGNOSIS — C642 Malignant neoplasm of left kidney, except renal pelvis: Secondary | ICD-10-CM | POA: Diagnosis not present

## 2016-10-01 DIAGNOSIS — S3994XA Unspecified injury of external genitals, initial encounter: Secondary | ICD-10-CM | POA: Diagnosis not present

## 2016-10-01 DIAGNOSIS — H543 Unqualified visual loss, both eyes: Secondary | ICD-10-CM

## 2016-10-01 DIAGNOSIS — I209 Angina pectoris, unspecified: Secondary | ICD-10-CM | POA: Diagnosis not present

## 2016-10-01 DIAGNOSIS — I25119 Atherosclerotic heart disease of native coronary artery with unspecified angina pectoris: Secondary | ICD-10-CM | POA: Diagnosis not present

## 2016-10-01 DIAGNOSIS — N401 Enlarged prostate with lower urinary tract symptoms: Secondary | ICD-10-CM

## 2016-10-01 HISTORY — DX: Benign prostatic hyperplasia with lower urinary tract symptoms: N40.1

## 2016-10-01 LAB — BASIC METABOLIC PANEL
Anion gap: 7 (ref 5–15)
BUN: 14 mg/dL (ref 6–20)
CO2: 23 mmol/L (ref 22–32)
CREATININE: 1.17 mg/dL (ref 0.61–1.24)
Calcium: 8 mg/dL — ABNORMAL LOW (ref 8.9–10.3)
Chloride: 101 mmol/L (ref 101–111)
GFR calc Af Amer: >60 mL/min (ref >60)
GFR, EST NON AFRICAN AMERICAN: 55 mL/min — AB (ref >60)
GLUCOSE: 99 mg/dL (ref 65–99)
Potassium: 3.8 mmol/L (ref 3.5–5.1)
SODIUM: 131 mmol/L — AB (ref 135–145)

## 2016-10-01 LAB — MAGNESIUM: MAGNESIUM: 1.8 mg/dL (ref 1.7–2.4)

## 2016-10-01 MED ORDER — ENSURE ENLIVE PO LIQD: 237.0000 mL | Freq: Two times a day (BID) | ORAL | 12 refills | Status: DC

## 2016-10-01 NOTE — Progress Notes (Signed)
PASSR initiated referred to level II manual review, MUST ID [id number]. CSW will continue to check on PASSR.   Abundio Miu, [place]ore[place] Torres Social Worker [facility] Cell#: [phone]

## 2016-10-01 NOTE — Progress Notes (Signed)
Report given to nurse Cherlynn Kaiser at [facility]. Patient will be transferred to this facility today.

## 2016-10-01 NOTE — Care Management Note (Signed)
Case Management Note  Patient Details  Name: Alexander Rosales MRN: [id number] Date of Birth: [birth year]  Subjective/Objective:  D/C SNF w/[place]-CSW already following                  Action/Plan:d/c SNF.   Expected Discharge Date:  10/01/16               Expected Discharge Plan:  Skilled Nursing Facility  In-House Referral:  Clinical Social Work  Discharge planning Services  CM Consult  Post Acute Care Choice:    Choice offered to:     DME Arranged:    DME Agency:     HH Arranged:    [place] Agency:     Status of Service:  Completed, signed off  If discussed at [employer], dates discussed:    Additional Comments:  Dessa Phi, RN 10/01/2016, 2:21 PM

## 2016-10-01 NOTE — Care Management Important Message (Signed)
Important Message  Patient Details  Name: Alexander Rosales MRN: [id number] Date of Birth: February 04, 1930   Medicare Important Message Given:  Yes    Kerin Salen 10/01/2016, 11:21 AMImportant Message  Patient Details  Name: Alexander Rosales MRN: [id number] Date of Birth: October 08, 1929   Medicare Important Message Given:  Yes    Kerin Salen 10/01/2016, 11:21 AM

## 2016-10-01 NOTE — Discharge Summary (Signed)
Physician Discharge Summary  Alexander Rosales  NTZ:[id number]  DOB: [birth year]  DOA: 09/27/2016 PCP: Kristine Garbe, MD  Admit date: 09/27/2016 Discharge date: 10/01/2016  Admitted From: ALF  Disposition:  SNF  Recommendations for Outpatient Follow-up:  1. Follow up with PCP in 1-2 weeks 2. Please obtain BMP/CBC in one week 3. [facility] appointment [facility], please call the office to get appointment information.   Discharge Condition: Stable  CODE STATUS: DNR  Diet recommendation: Regular   Brief/Interim Summary: Alexander Migliaccio Goinsis a 83 y.o.malewith medical history significant of hypertension, BPH, hyperlipidemia, blind, deaf, mute, is being brought from his assisted living facility for failure to thrive. Patient was found to have a creatinine of 24.6, BUN of 176, K of 6.4 and bicarbonate 16 and severely distended bladder. Foley catheter was placed yieldabout 1.2 L of urine. Patient was admitted for post obstructive acute kidney failure, and UA grossly abnormal. Patient was treated with IVF, and Rocephin for 5 days. Void trial was attempted but patient failed retaining aprox 700 cc on bladder scan, so foley catheter was placed back. Urology was consulted and recommended to leave foley cath in place for 7 days and continuation of Flomax, with follow up as outpatient.  Patient Cr subsequently decreased to normal levels. Patient is tolerating food, work with PT whom recommended SNF for PT. Patient will be discharge to SNF.   Subjective: Patient blind, deaf and mute, unable to communicate or provide information. Continues to be very responsive to tactile stimulation. Per nursing staff tolerating diet very well. No acute events overnight.   Discharge Diagnoses/Hospital Course:  Post obstructive acute renal failure on chronic kidney disease stage III - creatinine normalizing Good urine output. Secondary to BPH Initialcreatinine of 24.6 Unknown baseline creatinine appears to have CKD  stage III Most recent creatinine was done in February 2018 and was 1.5, while he was in the ED  Case was discussed with nephrology recommending renal ultrasound which showed bilateral hydronephrosis. Monitor Cr in 1 week   BPH In setting of acute renal failure, Foley was place in the ED yield about 1.2L  Foley was removed, but failed to urinate, retained about 700 cc.  Case discussed with Urology, whom recommending to keep foley for 7 day and follow up with Urology as outpatient. PSA perviously elevated.  Cont Flomax daily  Hypertension - BP stable Continue home medications  Deaf mutism, blindness, Patient very interactive with nurse, eating and drinking well.  Hyperkalemia - resolve  Treated with Kayexalate in the ED Continue to monitor  UTI - UA grossly abnormal He unable to verbalize symptoms, but given mild microcytosis and UA results was started on empiric antibiotic.  Initially treated with rocephin urine cultures shows no growth.  Was treated with 5 days of Rocephin   Anemia- d/t dilution  Pt still within baseline for him Monitor outpatient  All other chronic medical condition were stable during the hospitalization.  Patient was seen by physical therapy, recommending SNF  On the day of the discharge the patient's vitals were stable, and no other acute medical condition were reported by patient. Patient was felt safe to be discharge to SNF   Discharge Instructions  You were cared for by a hospitalist during your hospital stay. If you have any questions about your discharge medications or the care you received while you were in the hospital after you are discharged, you can call the unit and asked to speak with the hospitalist on call if the hospitalist  that took care of you is not available. Once you are discharged, your primary care physician will handle any further medical issues. Please note that NO REFILLS for any discharge medications will be authorized once you  are discharged, as it is imperative that you return to your primary care physician (or establish a relationship with a primary care physician if you do not have one) for your aftercare needs so that they can reassess your need for medications and monitor your lab values.  Discharge Instructions    Call MD for:  difficulty breathing, headache or visual disturbances    Complete by:  As directed    Call MD for:  extreme fatigue    Complete by:  As directed    Call MD for:  hives    Complete by:  As directed    Call MD for:  persistant dizziness or light-headedness    Complete by:  As directed    Call MD for:  persistant nausea and vomiting    Complete by:  As directed    Call MD for:  redness, tenderness, or signs of infection (pain, swelling, redness, odor or green/yellow discharge around incision site)    Complete by:  As directed    Call MD for:  severe uncontrolled pain    Complete by:  As directed    Call MD for:  temperature >100.4    Complete by:  As directed    Diet general    Complete by:  As directed    Increase activity slowly    Complete by:  As directed      Allergies as of 10/01/2016      Reactions   Aspirin Other (See Comments)   Reaction:  Unknown       Medication List    TAKE these medications   amLODipine-benazepril 10-20 MG capsule Commonly known as:  LOTREL Take 1 capsule by mouth daily.   feeding supplement (ENSURE ENLIVE) Liqd Take 237 mLs by mouth 2 (two) times daily between meals.   hydrALAZINE 25 MG tablet Commonly known as:  APRESOLINE Take 1 tablet (25 mg total) by mouth 2 (two) times daily.   hydrochlorothiazide 12.5 MG tablet Commonly known as:  HYDRODIURIL Take 12.5 mg by mouth daily.   loperamide 2 MG capsule Commonly known as:  IMODIUM Take 2 mg by mouth daily.   metoprolol 100 MG tablet Commonly known as:  LOPRESSOR Take 100 mg by mouth daily.   ranitidine 150 MG tablet Commonly known as:  ZANTAC Take 150 mg by mouth at  bedtime.   tamsulosin 0.4 MG Caps capsule Commonly known as:  FLOMAX Take 1 capsule (0.4 mg total) by mouth daily.      [facility] Call in 1 day(s).   Why:  Call to verify appointment  Contact information: [address]  [place] [address] [place] [address] [phone]          Allergies  Allergen Reactions  . Aspirin Other (See Comments)    Reaction:  Unknown     Consultations:  Urology    Procedures/Studies: US Renal  Result Date: 09/28/2016 CLINICAL DATA:  Acute renal injury, is elevated BUN and creatinine. EXAM: RENAL / URINARY TRACT ULTRASOUND COMPLETE COMPARISON:  None. FINDINGS: Right Kidney: Length: 10.3 cm. Moderate hydronephrosis. Cortical thickness and echogenicity is grossly normal. Left Kidney: Length: 9.3 cm. Moderate hydronephrosis. Cortical thickness and echogenicity is grossly normal. Bladder: Decompressed by Foley catheter. Bladder wall  appears thickened but this may be accentuated by the decompression. IMPRESSION: 1. Bilateral hydronephrosis, at least moderate in degree bilaterally. Consider CT for further characterization. 2. Bladder decompressed by Foley catheter. Questionable bladder wall thickening. Electronically Signed   By: Franki Cabot M.D.   On: 09/28/2016 16:37     Discharge Exam: Vitals:   09/30/16 2135 10/01/16 0457  BP: (!) 150/78 (!) 157/74  Pulse: (!) 114 99  Resp: 18 18  Temp: 97.4 F (36.3 C) 99.1 F (37.3 C)   Vitals:   09/30/16 1434 09/30/16 1544 09/30/16 2135 10/01/16 0457  BP: (!) 154/74  (!) 150/78 (!) 157/74  Pulse: (!) 115  (!) 114 99  Resp: 18  18 18   Temp:  98.4 F (36.9 C) 97.4 F (36.3 C) 99.1 F (37.3 C)  TempSrc:  Oral Axillary Axillary  SpO2: 99% 98% 100% 100%  Weight:      Height:        General: Lying in bed  Cardiovascular: RRR, S1/S2 +, no rubs, no gallops Respiratory: CTA bilaterally, no wheezing, no rhonchi Abdominal: Soft, NT, ND, bowel sounds  + Extremities: no edema, no cyanosis    The results of significant diagnostics from this hospitalization (including imaging, microbiology, ancillary and laboratory) are listed below for reference.     Microbiology: Recent Results (from the past 240 hour(s))  Culture, Urine     Status: None   Collection Time: 09/27/16  4:54 PM  Result Value Ref Range Status   Specimen Description URINE, RANDOM  Final   Special Requests NONE  Final   Culture   Final    NO GROWTH Performed at [facility], [address], [place], [place] [address]    Report Status 09/29/2016 FINAL  Final  MRSA PCR Screening     Status: None   Collection Time: 09/27/16  9:13 PM  Result Value Ref Range Status   MRSA by PCR NEGATIVE NEGATIVE Final    Comment:        The GeneXpert MRSA Assay (FDA approved for NASAL specimens only), is one component of a comprehensive MRSA colonization surveillance program. It is not intended to diagnose MRSA infection nor to guide or monitor treatment for MRSA infections.      Labs: BNP (last 3 results) No results for input(s): BNP in the last 8760 hours. Basic Metabolic Panel:  Recent Labs Lab 09/28/16 0526 09/29/16 0507 09/30/16 0829 09/30/16 1500 10/01/16 0655  NA 135 139 136 133* 131*  K 3.5 3.1* 3.7 4.2 3.8  CL 99* 103 104 102 101  CO2 19* 28 26 23 23   GLUCOSE 153* 107* 97 168* 99  BUN 116* 50* 19 17 14   CREATININE 10.48* 2.44* 1.21 1.24 1.17  CALCIUM 9.0 8.9 8.5* 8.5* 8.0*  MG  --   --   --  1.3* 1.8   Liver Function Tests:  Recent Labs Lab 09/27/16 1445 09/28/16 0526 09/29/16 0507 09/30/16 1500  AST 25 25 27  33  ALT 23 22 22 21   ALKPHOS 55 44 44 45  BILITOT 0.6 1.1 1.0 0.6  PROT 6.9 5.9* 6.0* 6.1*  ALBUMIN 3.5 2.8* 2.9* 2.8*   No results for input(s): LIPASE, AMYLASE in the last 168 hours. No results for input(s): AMMONIA in the last 168 hours. CBC:  Recent Labs Lab 09/27/16 1445 09/28/16 0526 09/29/16 0507 09/30/16 0829  WBC  13.2* 9.8 12.1* 12.2*  NEUTROABS  --   --  9.5*  --   HGB 11.7*  10.6* 10.7* 10.5*  HCT 33.3* 30.2* 31.3* 30.4*  MCV 84.7 85.1 87.4 88.4  PLT 185 166 155 156   Cardiac Enzymes: No results for input(s): CKTOTAL, CKMB, CKMBINDEX, TROPONINI in the last 168 hours. BNP: Invalid input(s): POCBNP CBG: No results for input(s): GLUCAP in the last 168 hours. D-Dimer No results for input(s): DDIMER in the last 72 hours. Hgb A1c No results for input(s): HGBA1C in the last 72 hours. Lipid Profile No results for input(s): CHOL, HDL, LDLCALC, TRIG, CHOLHDL, LDLDIRECT in the last 72 hours. Thyroid function studies No results for input(s): TSH, T4TOTAL, T3FREE, THYROIDAB in the last 72 hours.  Invalid input(s): FREET3 Anemia work up No results for input(s): VITAMINB12, FOLATE, FERRITIN, TIBC, IRON, RETICCTPCT in the last 72 hours. Urinalysis    Component Value Date/Time   COLORURINE YELLOW 09/27/2016 1654   APPEARANCEUR HAZY (A) 09/27/2016 1654   LABSPEC 1.010 09/27/2016 1654   PHURINE 6.0 09/27/2016 1654   GLUCOSEU NEGATIVE 09/27/2016 1654   HGBUR LARGE (A) 09/27/2016 1654   BILIRUBINUR NEGATIVE 09/27/2016 1654   KETONESUR NEGATIVE 09/27/2016 1654   PROTEINUR 100 (A) 09/27/2016 1654   UROBILINOGEN 0.2 09/16/2011 1400   NITRITE NEGATIVE 09/27/2016 1654   LEUKOCYTESUR LARGE (A) 09/27/2016 1654   Sepsis Labs Invalid input(s): PROCALCITONIN,  WBC,  LACTICIDVEN Microbiology Recent Results (from the past 240 hour(s))  Culture, Urine     Status: None   Collection Time: 09/27/16  4:54 PM  Result Value Ref Range Status   Specimen Description URINE, RANDOM  Final   Special Requests NONE  Final   Culture   Final    NO GROWTH Performed at [facility], [place] [address] [place], [place] [address]    Report Status 09/29/2016 FINAL  Final  MRSA PCR Screening     Status: None   Collection Time: 09/27/16  9:13 PM  Result Value Ref Range Status   MRSA by PCR NEGATIVE NEGATIVE Final     Comment:        The GeneXpert MRSA Assay (FDA approved for NASAL specimens only), is one component of a comprehensive MRSA colonization surveillance program. It is not intended to diagnose MRSA infection nor to guide or monitor treatment for MRSA infections.      Time coordinating discharge: 35 minutes  SIGNED:  Chipper Oman, MD  [facility] 10/01/2016, 2:09 PM  Pager please text page via  [email or web address] Password [email or web address]

## 2016-10-01 NOTE — Clinical Social Work Placement (Signed)
Patient received and accepted bed offer at [facility] contacted, family aware. Patient's RN aware and provided with number to call report. Packet complete.   CLINICAL SOCIAL WORK PLACEMENT  NOTE  Date:  10/01/2016  Patient Details  Name: [name] MRN: [id number] Date of Birth: [birth year]  Clinical Social Work is seeking post-discharge placement for this patient at the [place] level of care (*CSW will initial, date and re-position this form in  chart as items are completed):  Yes   Patient/family provided with [place] Work Department's list of facilities offering this level of care within the geographic area requested by the patient (or if unable, by the patient's family).  Yes   Patient/family informed of their freedom to choose among providers that offer the needed level of care, that participate in Medicare, Medicaid or managed care program needed by the patient, have an available bed and are willing to accept the patient.  Yes   Patient/family informed of [facility]'s ownership interest in [facility] and [facility], as well as of the fact that they are under no obligation to receive care at these facilities.  PASRR submitted to EDS on 10/01/16     PASRR number received on 10/01/16     Existing PASRR number confirmed on       FL2 transmitted to all facilities in geographic area requested by pt/family on 09/29/16     FL2 transmitted to all facilities within larger geographic area on       Patient informed that his/her managed care company has contracts with or will negotiate with certain facilities, including the following:        Yes   Patient/family informed of bed offers received.  Patient chooses bed at [facility]     Physician recommends and patient chooses bed at      Patient to be transferred to [facility] on 10/01/16.  Patient to be transferred to facility by [facility]      Patient family notified on 10/01/16 of transfer.  Name of family member notified:  Alexander Rosales     PHYSICIAN       Additional Comment:    _______________________________________________ Burnis Medin, LCSW 10/01/2016, 4:06 PM

## 2016-10-02 ENCOUNTER — Encounter: Payer: Self-pay | Admitting: Adult Health

## 2016-10-02 ENCOUNTER — Non-Acute Institutional Stay (SKILLED_NURSING_FACILITY): Payer: Medicare Other | Admitting: Adult Health

## 2016-10-02 DIAGNOSIS — H543 Unqualified visual loss, both eyes: Secondary | ICD-10-CM | POA: Diagnosis not present

## 2016-10-02 DIAGNOSIS — N401 Enlarged prostate with lower urinary tract symptoms: Secondary | ICD-10-CM

## 2016-10-02 DIAGNOSIS — R338 Other retention of urine: Secondary | ICD-10-CM

## 2016-10-02 DIAGNOSIS — N183 Chronic kidney disease, stage 3 unspecified: Secondary | ICD-10-CM

## 2016-10-02 DIAGNOSIS — I129 Hypertensive chronic kidney disease with stage 1 through stage 4 chronic kidney disease, or unspecified chronic kidney disease: Secondary | ICD-10-CM | POA: Diagnosis not present

## 2016-10-02 DIAGNOSIS — N138 Other obstructive and reflux uropathy: Secondary | ICD-10-CM

## 2016-10-02 DIAGNOSIS — C642 Malignant neoplasm of left kidney, except renal pelvis: Secondary | ICD-10-CM | POA: Diagnosis not present

## 2016-10-02 DIAGNOSIS — I25119 Atherosclerotic heart disease of native coronary artery with unspecified angina pectoris: Secondary | ICD-10-CM | POA: Diagnosis not present

## 2016-10-02 NOTE — Progress Notes (Signed)
Location:   [facility] Room Number: 213 A Place of Service:  SNF (31)   CODE STATUS:  Full Code  Allergies  Allergen Reactions  . Aspirin Other (See Comments)    Reaction:  Unknown     Chief Complaint  Patient presents with  . Hospitalization Follow-up    Hospital follow up    HPI:  He has been hospitalized for acute renal failure on chronic renal failure; acute urinary retention requiring foley placement. He is unable to fully participate in the hpi as he is mute deaf and blind. More than likely this is a long term placement for him. There are no nursing concerns at this time.     Past Medical History:  Diagnosis Date  . Blind   . CAD (coronary artery disease)   . Chronic kidney disease   . Deaf   . GERD (gastroesophageal reflux disease)   . Hyperlipidemia   . Hypertension   . Malignant neoplasm of kidney ([place])   . Mute    Past Surgical History:  Procedure Laterality Date  . CHOLECYSTECTOMY    . left partial nephrectomy        Social History   Social History  . Marital status: Single    Spouse name: N/A  . Number of children: N/A  . Years of education: N/A   Occupational History  . Not on file.   Social History Main Topics  . Smoking status: Never Smoker  . Smokeless tobacco: Never Used  . Alcohol use Not on file  . Drug use: Unknown  . Sexual activity: Not on file   Other Topics Concern  . Not on file   Social History Narrative  . No narrative on file   History reviewed. No pertinent family history.    VITAL SIGNS BP (!) 122/58   Pulse (!) 105   Temp 98.7 F (37.1 C)   Resp 18   Ht 5\' 1"  (1.549 m)   Wt 118 lb 9.6 oz (53.8 kg)   SpO2 98%   BMI 22.41 kg/m   Patient's Medications  New Prescriptions   No medications on file  Previous Medications   AMLODIPINE-BENAZEPRIL (LOTREL) 10-20 MG PER CAPSULE    Take 1 capsule by mouth daily.   HYDRALAZINE (APRESOLINE) 25 MG TABLET    Take 1 tablet (25 mg total) by mouth 2  (two) times daily.   HYDROCHLOROTHIAZIDE (HYDRODIURIL) 12.5 MG TABLET    Take 12.5 mg by mouth daily.   LOPERAMIDE (IMODIUM) 2 MG CAPSULE    Take 2 mg by mouth daily.   METOPROLOL (LOPRESSOR) 100 MG TABLET    Take 100 mg by mouth daily.   NUTRITIONAL SUPPLEMENTS (NUTRITIONAL SUPPLEMENT PO)    Take by mouth. House 2.0 - give 237 ml two times daily   RANITIDINE (ZANTAC) 150 MG TABLET    Take 150 mg by mouth at bedtime.   TAMSULOSIN HCL (FLOMAX) 0.4 MG CAPS    Take 1 capsule (0.4 mg total) by mouth daily.  Modified Medications   No medications on file  Discontinued Medications   FEEDING SUPPLEMENT, ENSURE ENLIVE, (ENSURE ENLIVE) LIQD    Take 237 mLs by mouth 2 (two) times daily between meals.     SIGNIFICANT DIAGNOSTIC EXAMS  09-28-16: renal ultrasound: 1. Bilateral hydronephrosis, at least moderate in degree bilaterally. Consider CT for further characterization. 2. Bladder decompressed by Foley catheter. Questionable bladder wall thickening.   LABS REVIEWED:   09-27-16: wbc 13.2; hgb 11.7; hct  33.3; mcv 84.7; plt 185; glucose 116; bun 176; creat 24.64; k+ 6.4; na++ 130; liver normal albumin 3.5: urine culture: no growth 09-29-16; wbc 12.1; hgb 10.7; hct 31.3; mcv 87.4; plt 155; glucose 107; bun 50; creat 2.44; k+ 3.1; na++ 139; liver normal albumin 2.9 10-01-16: glucose 131; bun 14; creat 1.17; k+ 3.8; na++ 131; (creat clear 30.51) mag 1.8    Review of Systems  Unable to perform ROS: Other (deaf; mute; blind )    Physical Exam  Constitutional: No distress.  Frail   Eyes: Conjunctivae are normal.  Neck: Neck supple. No JVD present. No thyromegaly present.  Cardiovascular: Normal rate, normal heart sounds and intact distal pulses.   Heart rate irregular   Respiratory: Effort normal and breath sounds normal. No respiratory distress. He has no wheezes.  GI: Soft. Bowel sounds are normal. He exhibits no distension. There is no tenderness.  Genitourinary:  Genitourinary Comments: Foley     Musculoskeletal: He exhibits no edema.  Able to move all extremities   Lymphadenopathy:    He has no cervical adenopathy.  Neurological: He is alert.  Is aware of her surroundings   Skin: Skin is warm and dry. He is not diaphoretic.      ASSESSMENT/ PLAN:  1. Hypertension: will continue lotrel 10/20 mg daily apresoline 100 mg daily; lopressor 100 mg daily. In light of his renal failure will stop the hctz and will monitor his blood pressure.   2. CAD: is presently stable will continue lopressor 100 mg daily   3. gerd: will continue zantac 150 mg daily   4. Chronic diarrhea: is taking imodium 2 mg daily will monitor   5. BPH with acute urine retention: has foley in place will continue flomax 0.4 mg daily and will setup a urology follow up for the next week  6. CKD stage III: is status post partial left nephrectomy due to malignant neoplasm:  bun 14; creat 1.17;(creat clear 30.51) will monitor   7. 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

## 2016-10-09 ENCOUNTER — Non-Acute Institutional Stay (SKILLED_NURSING_FACILITY): Payer: Medicare Other | Admitting: Internal Medicine

## 2016-10-09 ENCOUNTER — Encounter: Payer: Self-pay | Admitting: Internal Medicine

## 2016-10-09 DIAGNOSIS — N138 Other obstructive and reflux uropathy: Secondary | ICD-10-CM | POA: Diagnosis not present

## 2016-10-09 DIAGNOSIS — I10 Essential (primary) hypertension: Secondary | ICD-10-CM | POA: Diagnosis not present

## 2016-10-09 DIAGNOSIS — N401 Enlarged prostate with lower urinary tract symptoms: Secondary | ICD-10-CM | POA: Diagnosis not present

## 2016-10-09 DIAGNOSIS — N179 Acute kidney failure, unspecified: Secondary | ICD-10-CM | POA: Diagnosis not present

## 2016-10-09 DIAGNOSIS — H913 Deaf nonspeaking, not elsewhere classified: Secondary | ICD-10-CM

## 2016-10-09 NOTE — Progress Notes (Addendum)
Provider: Veleta Miners  Location:   [facility] Room Number: 213/A Place of Service:  SNF (31)  PCP: Kristine Garbe, MD Patient Care Team: Lin Landsman, MD as PCP - General [facility] Medicine)  Extended Emergency Contact Information Primary Emergency Contact: Cousar,[name] Address: [address]          [place], [place] [address] [place] Phone: [phone] Relation: Other Secondary Emergency Contact: [name] Address: [address]          [place], [place] [address] [place] Phone: [phone] Relation: Niece  Code Status: DNR Goals of Care: Advanced Directive information Advanced Directives 10/09/2016  Does Patient Have a Medical Advance Directive? Yes  Type of Advance Directive Out of facility DNR (pink MOST or yellow form)  Does patient want to make changes to medical advance directive? No - Patient declined  Would patient like information on creating a medical advance directive? No - Patient declined      Chief Complaint  Patient presents with  . New Admit To SNF    HPI: Patient is a 83 y.o. male seen today for admission to SNF for therapy and possible long term placement. Patient has h/o Deaf, [place] and Blind, Hypertension, BPH, Chronic Renal disease, Hyperlipidemia and CAD.  He was initially admitted with weakness and increased confusion. Was found to have Acute renal failure. With Creat of 24.64 and Hyperkalemia with K of 6.4. Foley Catheter was placed and got 1.2 L of urine. Urology recommended to place foley cathter and continue Flomax. His Renal US showed Bilateral Hydronephrosis. His creat came down with hydration and Foley catheter. He was empirically treated for UTI but culture was negative for any growth. Patient was unable to give me any history. According to nurses he does interact with them. He does eat. They are saying they have not seen him Any distress. It seems patient was living in  assisted facility before. Patient would be long term resident here.  Past Medical History:  Diagnosis Date  . Blind   . CAD (coronary artery disease)   . Chronic kidney disease   . Deaf   . GERD (gastroesophageal reflux disease)   . Hyperlipidemia   . Hypertension   . Malignant neoplasm of kidney ([place])   . Mute    Past Surgical History:  Procedure Laterality Date  . CHOLECYSTECTOMY    . left partial nephrectomy      reports that he has never smoked. He has never used smokeless tobacco. His alcohol and drug histories are not on file. Social History   Social History  . Marital status: Single    Spouse name: N/A  . Number of children: N/A  . Years of education: N/A   Occupational History  . Not on file.   Social History Main Topics  . Smoking status: Never Smoker  . Smokeless tobacco: Never Used  . Alcohol use Not on file  . Drug use: Unknown  . Sexual activity: Not on file   Other Topics Concern  . Not on file   Social History Narrative  . No narrative on file    Functional Status Survey:    History reviewed. No pertinent family history.  Health Maintenance  Topic Date Due  . TETANUS/TDAP  10/02/2017 (Originally 07/31/1948)  . PNA vac Low Risk Adult (1 of 2 - PCV13) 10/02/2017 (Originally 07/31/1996)  . INFLUENZA VACCINE  01/22/2017    Allergies  Allergen Reactions  . Aspirin Other (See  Comments)    Reaction:  Unknown     Allergies as of 10/09/2016      Reactions   Aspirin Other (See Comments)   Reaction:  Unknown       Medication List       Accurate as of 10/09/16  3:20 PM. Always use your most recent med list.          amLODipine-benazepril 10-20 MG capsule Commonly known as:  LOTREL Take 1 capsule by mouth daily.   hydrALAZINE 25 MG tablet Commonly known as:  APRESOLINE Take 1 tablet (25 mg total) by mouth 2 (two) times daily.   hydrochlorothiazide 12.5 MG tablet Commonly known as:  HYDRODIURIL Take 12.5 mg by mouth daily.     loperamide 2 MG capsule Commonly known as:  IMODIUM Take 2 mg by mouth daily.   metoprolol 100 MG tablet Commonly known as:  LOPRESSOR Take 100 mg by mouth daily.   NUTRITIONAL SUPPLEMENT PO Take by mouth. House 2.0 - give 237 ml two times daily   ranitidine 150 MG tablet Commonly known as:  ZANTAC Take 150 mg by mouth at bedtime.   tamsulosin 0.4 MG Caps capsule Commonly known as:  FLOMAX Take 1 capsule (0.4 mg total) by mouth daily.       Review of Systems  Unable to perform ROS: Other    Vitals:   10/09/16 1519  BP: (!) 157/86  Pulse: 70  Resp: 20  Temp: 97.8 F (36.6 C)   There is no height or weight on file to calculate BMI. Physical Exam  Constitutional: He appears well-developed. He appears cachectic.  HENT:  Head: Normocephalic.  Wouldn't open his mouth  Eyes: Conjunctivae are normal.  Neck: Neck supple.  Cardiovascular: Normal rate, regular rhythm and normal heart sounds.   No murmur heard. Pulmonary/Chest: Effort normal and breath sounds normal. No respiratory distress. He has no wheezes. He has no rales.  Abdominal: Soft. Bowel sounds are normal. He exhibits no distension. There is no tenderness. There is no rebound.  Genitourinary:  Genitourinary Comments: Has Foley Cathter.  Musculoskeletal: He exhibits no edema.  Neurological: He is alert.  Couldn't do any detail exam as he doesn't follow any commands. Not sure due to him being Deaf and [place].  Skin: Skin is warm and dry.  Psychiatric:  Couldn't exam.    Labs reviewed: Basic Metabolic Panel:  Recent Labs  09/30/16 0829 09/30/16 1500 10/01/16 0655  NA 136 133* 131*  K 3.7 4.2 3.8  CL 104 102 101  CO2 26 23 23   GLUCOSE 97 168* 99  BUN 19 17 14   CREATININE 1.21 1.24 1.17  CALCIUM 8.5* 8.5* 8.0*  MG  --  1.3* 1.8   Liver Function Tests:  Recent Labs  09/28/16 0526 09/29/16 0507 09/30/16 1500  AST 25 27 33  ALT 22 22 21   ALKPHOS 44 44 45  BILITOT 1.1 1.0 0.6  PROT 5.9*  6.0* 6.1*  ALBUMIN 2.8* 2.9* 2.8*   No results for input(s): LIPASE, AMYLASE in the last 8760 hours. No results for input(s): AMMONIA in the last 8760 hours. CBC:  Recent Labs  09/28/16 0526 09/29/16 0507 09/30/16 0829  WBC 9.8 12.1* 12.2*  NEUTROABS  --  9.5*  --   HGB 10.6* 10.7* 10.5*  HCT 30.2* 31.3* 30.4*  MCV 85.1 87.4 88.4  PLT 166 155 156   Cardiac Enzymes: No results for input(s): CKTOTAL, CKMB, CKMBINDEX, TROPONINI in the last 8760 hours. BNP: Invalid input(s):  POCBNP Lab Results  Component Value Date   HGBA1C  03/07/2009    6.1 (NOTE) The ADA recommends the following therapeutic goal for glycemic control related to Hgb A1c measurement: Goal of therapy: <6.5 Hgb A1c  Reference: American Diabetes Association: Clinical Practice Recommendations 2010, Diabetes Care, 2010, 33: (Suppl  1).    Lab Results  Component Value Date   QIWLNLGX21 194 02/06/2007   No results found for: FOLATE No results found for: IRON, TIBC, FERRITIN  Imaging and Procedures obtained prior to SNF admission: US Renal  Result Date: 09/28/2016 CLINICAL DATA:  Acute renal injury, is elevated BUN and creatinine. EXAM: RENAL / URINARY TRACT ULTRASOUND COMPLETE COMPARISON:  None. FINDINGS: Right Kidney: Length: 10.3 cm. Moderate hydronephrosis. Cortical thickness and echogenicity is grossly normal. Left Kidney: Length: 9.3 cm. Moderate hydronephrosis. Cortical thickness and echogenicity is grossly normal. Bladder: Decompressed by Foley catheter. Bladder wall appears thickened but this may be accentuated by the decompression. IMPRESSION: 1. Bilateral hydronephrosis, at least moderate in degree bilaterally. Consider CT for further characterization. 2. Bladder decompressed by Foley catheter. Questionable bladder wall thickening. Electronically Signed   By: Franki Cabot M.D.   On: 09/28/2016 16:37    Assessment/Plan  Essential hypertension His BP is slightly elevated today. His HCTZ was stopped due to  CKD. Nut will restart at low dose of 12.5 mg QD. Follow BP regularly. Follow up labs in 1 week.  Acute renal failure Due to Combination of Obstructive Uropathy and dehydration His creat is now back to baseline  BPH with urinary obstruction Patient has Bilateral Hydronephrosis And had Urinary retention. Will continue Foley right now till he follows with Urologist. As due to him being deaf and [facility] he would not be able to tell the nurses. He is on Flomax.  Diarrhea He is on Loperamide for diarrhea not sure why. Will get Stool for C.Diff  Anemia Most likely due to Chronic disese. Check Iron studies, Vit B12 Folate and TSH.    Family/ staff Communication:   Labs/tests ordered: CBC, BMP, TSH, Vit D level, Vit B12 Folate

## 2016-10-16 LAB — BASIC METABOLIC PANEL
BUN: 16 mg/dL (ref 4–21)
Creatinine: 1 mg/dL (ref 0.6–1.3)
Glucose: 89 mg/dL
Potassium: 3.8 mmol/L (ref 3.4–5.3)
SODIUM: 136 mmol/L — AB (ref 137–147)

## 2016-10-16 LAB — CBC AND DIFFERENTIAL
HEMATOCRIT: 36 % — AB (ref 41–53)
HEMOGLOBIN: 12.2 g/dL — AB (ref 13.5–17.5)
NEUTROS ABS: 6 /uL
PLATELETS: 340 10*3/uL (ref 150–399)
WBC: 9.9 10^3/mL

## 2016-10-23 ENCOUNTER — Encounter: Payer: Self-pay | Admitting: Adult Health

## 2016-10-23 ENCOUNTER — Non-Acute Institutional Stay (SKILLED_NURSING_FACILITY): Payer: Medicare Other | Admitting: Adult Health

## 2016-10-23 DIAGNOSIS — N4822 Cellulitis of corpus cavernosum and penis: Secondary | ICD-10-CM

## 2016-10-23 NOTE — Progress Notes (Signed)
Location:   [facility] Room Number: 295 A Place of Service:  SNF (31)   CODE STATUS: DNR  Allergies  Allergen Reactions  . Aspirin Other (See Comments)    Reaction:  Unknown     Chief Complaint  Patient presents with  . Acute Visit    Penile Edema    HPI:  Staff reports that he has significant penile edema present. There are no reports of fever present. There is a red ulceration present at the meatus. There is a great deal of pain present. He is unable to participate in the hpi or ros.    Past Medical History:  Diagnosis Date  . Blind   . CAD (coronary artery disease)   . Chronic kidney disease   . Deaf   . GERD (gastroesophageal reflux disease)   . Hyperlipidemia   . Hypertension   . Malignant neoplasm of kidney ([place])   . Mute     Past Surgical History:  Procedure Laterality Date  . CHOLECYSTECTOMY    . left partial nephrectomy      Social History   Social History  . Marital status: Single    Spouse name: N/A  . Number of children: N/A  . Years of education: N/A   Occupational History  . Not on file.   Social History Main Topics  . Smoking status: Never Smoker  . Smokeless tobacco: Never Used  . Alcohol use Not on file  . Drug use: Unknown  . Sexual activity: Not on file   Other Topics Concern  . Not on file   Social History Narrative  . No narrative on file   History reviewed. No pertinent family history.    VITAL SIGNS BP 134/73   Pulse 95   Temp 97.6 F (36.4 C)   Resp 18   Ht 5\' 1"  (1.549 m)   Wt 118 lb 10 oz (53.8 kg)   SpO2 98%   BMI 22.41 kg/m   Patient's Medications  New Prescriptions   No medications on file  Previous Medications   AMLODIPINE-BENAZEPRIL (LOTREL) 10-20 MG PER CAPSULE    Take 1 capsule by mouth daily.   HYDRALAZINE (APRESOLINE) 25 MG TABLET    Take 1 tablet (25 mg total) by mouth 2 (two) times daily.   HYDROCHLOROTHIAZIDE (HYDRODIURIL) 12.5 MG TABLET    Take 12.5 mg by mouth daily.   LOPERAMIDE (IMODIUM) 2 MG CAPSULE    Take 2 mg by mouth daily.   METOPROLOL (LOPRESSOR) 100 MG TABLET    Take 100 mg by mouth daily.   NUTRITIONAL SUPPLEMENTS (NUTRITIONAL SUPPLEMENT PO)    Take by mouth. House 2.0 - give 237 ml two times daily   RANITIDINE (ZANTAC) 150 MG TABLET    Take 150 mg by mouth at bedtime.   TAMSULOSIN HCL (FLOMAX) 0.4 MG CAPS    Take 1 capsule (0.4 mg total) by mouth daily.  Modified Medications   No medications on file  Discontinued Medications   No medications on file     SIGNIFICANT DIAGNOSTIC EXAMS  09-28-16: renal ultrasound: 1. Bilateral hydronephrosis, at least moderate in degree bilaterally. Consider CT for further characterization. 2. Bladder decompressed by Foley catheter. Questionable bladder wall thickening.   LABS REVIEWED:   09-27-16: wbc 13.2; hgb 11.7; hct 33.3; mcv 84.7; plt 185; glucose 116; bun 176; creat 24.64; k+ 6.4; na++ 130; liver normal albumin 3.5: urine culture: no growth 09-29-16; wbc 12.1; hgb 10.7; hct 31.3; mcv 87.4; plt 155; glucose  107; bun 50; creat 2.44; k+ 3.1; na++ 139; liver normal albumin 2.9 10-01-16: glucose 131; bun 14; creat 1.17; k+ 3.8; na++ 131; (creat clear 30.51) mag 1.8    Review of Systems  Unable to perform ROS: Other (deaf; mute; blind )    Physical Exam  Constitutional: No distress.  Frail   Eyes: Conjunctivae are normal.  Neck: Neck supple. No JVD present. No thyromegaly present.  Cardiovascular: Normal rate, normal heart sounds and intact distal pulses.   Heart rate irregular   Respiratory: Effort normal and breath sounds normal. No respiratory distress. He has no wheezes.  GI: Soft. Bowel sounds are normal. He exhibits no distension. There is no tenderness.  Genitourinary:  Genitourinary Comments: Foley   Musculoskeletal: He exhibits no edema.  Able to move all extremities   Lymphadenopathy:    He has no cervical adenopathy.  Neurological: He is alert.  Is aware of his surroundings   Skin: Skin is  warm and dry. He is not diaphoretic.  Penis has a red ulceration with slough present at the meatus. There is significant swelling in the head of the penis. This could represent a a urinary fistula and/or cellulitis.      ASSESSMENT/ PLAN:  1. Penile cellulitis: will begin keflex 500 mg twice daily for 2 weeks with florastor. Will setup a urology consult for possible fistula      Ok Edwards NP [facility]  Contact [phone] Monday through Friday 8am- 5pm  After hours call [phone]

## 2016-10-31 DIAGNOSIS — N4822 Cellulitis of corpus cavernosum and penis: Secondary | ICD-10-CM | POA: Insufficient documentation

## 2016-11-01 ENCOUNTER — Encounter: Payer: Self-pay | Admitting: Adult Health

## 2016-11-01 ENCOUNTER — Non-Acute Institutional Stay (SKILLED_NURSING_FACILITY): Payer: Medicare Other | Admitting: Adult Health

## 2016-11-01 DIAGNOSIS — N138 Other obstructive and reflux uropathy: Secondary | ICD-10-CM | POA: Diagnosis not present

## 2016-11-01 DIAGNOSIS — R338 Other retention of urine: Secondary | ICD-10-CM

## 2016-11-01 DIAGNOSIS — I10 Essential (primary) hypertension: Secondary | ICD-10-CM

## 2016-11-01 DIAGNOSIS — I129 Hypertensive chronic kidney disease with stage 1 through stage 4 chronic kidney disease, or unspecified chronic kidney disease: Secondary | ICD-10-CM

## 2016-11-01 DIAGNOSIS — N401 Enlarged prostate with lower urinary tract symptoms: Secondary | ICD-10-CM | POA: Diagnosis not present

## 2016-11-01 DIAGNOSIS — N183 Chronic kidney disease, stage 3 unspecified: Secondary | ICD-10-CM

## 2016-11-01 DIAGNOSIS — C642 Malignant neoplasm of left kidney, except renal pelvis: Secondary | ICD-10-CM | POA: Diagnosis not present

## 2016-11-01 DIAGNOSIS — I25119 Atherosclerotic heart disease of native coronary artery with unspecified angina pectoris: Secondary | ICD-10-CM | POA: Diagnosis not present

## 2016-11-01 DIAGNOSIS — I209 Angina pectoris, unspecified: Secondary | ICD-10-CM

## 2016-11-01 DIAGNOSIS — N4822 Cellulitis of corpus cavernosum and penis: Secondary | ICD-10-CM | POA: Diagnosis not present

## 2016-11-01 NOTE — Progress Notes (Signed)
Location:   [facility] Room Number: 540 A Place of Service:  SNF (31)   CODE STATUS: DNR  Allergies  Allergen Reactions  . Aspirin Other (See Comments)    Reaction:  Unknown     Chief Complaint  Patient presents with  . Medical Management of Chronic Issues    1 month follow up    HPI:  He is a long term resident of this facility being seen for the management of his chronic illnesses.  He continues on kelfex for his penile cellulitis with his urology pending. He is unable to participate in the hpi or ros. There are no nursing concerns at this time.   Past Medical History:  Diagnosis Date  . Blind   . CAD (coronary artery disease)   . Chronic kidney disease   . Deaf   . GERD (gastroesophageal reflux disease)   . Hyperlipidemia   . Hypertension   . Malignant neoplasm of kidney ([place])   . Mute     Past Surgical History:  Procedure Laterality Date  . CHOLECYSTECTOMY    . left partial nephrectomy      Social History   Social History  . Marital status: Single    Spouse name: N/A  . Number of children: N/A  . Years of education: N/A   Occupational History  . Not on file.   Social History Main Topics  . Smoking status: Never Smoker  . Smokeless tobacco: Never Used  . Alcohol use Not on file  . Drug use: Unknown  . Sexual activity: Not on file   Other Topics Concern  . Not on file   Social History Narrative  . No narrative on file   History reviewed. No pertinent family history.    VITAL SIGNS BP 138/76   Pulse 88   Temp 98.1 F (36.7 C)   Resp 17   Ht 5\' 1"  (1.549 m)   Wt 118 lb 10 oz (53.8 kg)   SpO2 (!) 88%   BMI 22.41 kg/m   Patient's Medications  New Prescriptions   No medications on file  Previous Medications   AMLODIPINE-BENAZEPRIL (LOTREL) 10-20 MG PER CAPSULE    Take 1 capsule by mouth daily.   CEPHALEXIN (KEFLEX) 500 MG CAPSULE    Take 500 mg by mouth 2 (two) times daily.   HYDRALAZINE (APRESOLINE) 25 MG TABLET     Take 1 tablet (25 mg total) by mouth 2 (two) times daily.   LOPERAMIDE (IMODIUM) 2 MG CAPSULE    Take 2 mg by mouth daily.   METOPROLOL (LOPRESSOR) 100 MG TABLET    Take 100 mg by mouth daily.   NUTRITIONAL SUPPLEMENTS (NUTRITIONAL SUPPLEMENT PO)    Take by mouth. House 2.0 - give 237 ml two times daily   NUTRITIONAL SUPPLEMENTS (NUTRITIONAL SUPPLEMENT PO)    Nutritional Treat with meals for weight support   RANITIDINE (ZANTAC) 150 MG TABLET    Take 150 mg by mouth at bedtime.   SACCHAROMYCES BOULARDII (FLORASTOR) 250 MG CAPSULE    Take 250 mg by mouth 2 (two) times daily.   TAMSULOSIN HCL (FLOMAX) 0.4 MG CAPS    Take 1 capsule (0.4 mg total) by mouth daily.  Modified Medications   No medications on file  Discontinued Medications   HYDROCHLOROTHIAZIDE (HYDRODIURIL) 12.5 MG TABLET    Take 12.5 mg by mouth daily.     SIGNIFICANT DIAGNOSTIC EXAMS  09-28-16: renal ultrasound: 1. Bilateral hydronephrosis, at least moderate in degree bilaterally.  Consider CT for further characterization. 2. Bladder decompressed by Foley catheter. Questionable bladder wall thickening.   LABS REVIEWED:   09-27-16: wbc 13.2; hgb 11.7; hct 33.3; mcv 84.7; plt 185; glucose 116; bun 176; creat 24.64; k+ 6.4; na++ 130; liver normal albumin 3.5: urine culture: no growth 09-29-16; wbc 12.1; hgb 10.7; hct 31.3; mcv 87.4; plt 155; glucose 107; bun 50; creat 2.44; k+ 3.1; na++ 139; liver normal albumin 2.9 10-01-16: glucose 131; bun 14; creat 1.17; k+ 3.8; na++ 131; (creat clear 30.51) mag 1.8    Review of Systems  Unable to perform ROS: Other (deaf; mute; blind )    Physical Exam  Constitutional: No distress.  Frail   Eyes: Conjunctivae are normal.  Neck: Neck supple. No JVD present. No thyromegaly present.  Cardiovascular: Normal rate, normal heart sounds and intact distal pulses.   Heart rate irregular   Respiratory: Effort normal and breath sounds normal. No respiratory distress. He has no wheezes.  GI: Soft. Bowel  sounds are normal. He exhibits no distension. There is no tenderness.  Genitourinary:  Genitourinary Comments: Foley   Musculoskeletal: He exhibits no edema.  Able to move all extremities   Lymphadenopathy:    He has no cervical adenopathy.  Neurological: He is alert.  Is aware of his surroundings   Skin: Skin is warm and dry. He is not diaphoretic.    ASSESSMENT/ PLAN:  1. Hypertension: b/p 138/76 will continue lotrel 10/20 mg daily apresoline 25 mg  Twice daily; lopressor 100 mg daily.   2. CAD: is presently stable will continue lopressor 100 mg daily   3. gerd: will continue zantac 150 mg daily   4. Chronic diarrhea: is taking imodium 2 mg daily will monitor   5. BPH with acute urine retention: has foley in place will continue flomax 0.4 mg daily   6. CKD stage III: is status post partial left nephrectomy due to malignant neoplasm:  bun 14; creat 1.17;(creat clear 30.51) will monitor   7.  Penile cellulitis: will finish  keflex 500 mg twice daily for 2 weeks with florastor. Urology appointment pending.       Ok Edwards NP [facility]  Contact [phone] Monday through Friday 8am- 5pm  After hours call [phone]

## 2016-11-06 ENCOUNTER — Non-Acute Institutional Stay (SKILLED_NURSING_FACILITY): Payer: Medicare Other | Admitting: Adult Health

## 2016-11-06 ENCOUNTER — Encounter: Payer: Self-pay | Admitting: Adult Health

## 2016-11-06 DIAGNOSIS — N4889 Other specified disorders of penis: Secondary | ICD-10-CM | POA: Insufficient documentation

## 2016-11-06 NOTE — Progress Notes (Signed)
Location:   [place] Room Number: 570 A Place of Service:  SNF (31)   CODE STATUS: DNR  Allergies  Allergen Reactions  . Aspirin Other (See Comments)    Reaction:  Unknown     Chief Complaint  Patient presents with  . Acute Visit    Penile edema    HPI:  Staff reports that he is having increased penile edema. He is unable to participate in the hpi or ros. He does have pain with manipulation. He does have a foley in place  Past Medical History:  Diagnosis Date  . Blind   . CAD (coronary artery disease)   . Chronic kidney disease   . Deaf   . GERD (gastroesophageal reflux disease)   . Hyperlipidemia   . Hypertension   . Malignant neoplasm of kidney ([place])   . Mute     Past Surgical History:  Procedure Laterality Date  . CHOLECYSTECTOMY    . left partial nephrectomy      Social History   Social History  . Marital status: Single    Spouse name: N/A  . Number of children: N/A  . Years of education: N/A   Occupational History  . Not on file.   Social History Main Topics  . Smoking status: Never Smoker  . Smokeless tobacco: Never Used  . Alcohol use Not on file  . Drug use: Unknown  . Sexual activity: Not on file   Other Topics Concern  . Not on file   Social History Narrative  . No narrative on file   History reviewed. No pertinent family history.    VITAL SIGNS BP 124/78   Pulse 98   Temp 98.7 F (37.1 C)   Resp 20   Ht 5\' 1"  (1.549 m)   SpO2 97%   No other vitals available.   Patient's Medications  New Prescriptions   No medications on file  Previous Medications   AMLODIPINE-BENAZEPRIL (LOTREL) 10-20 MG PER CAPSULE    Take 1 capsule by mouth daily.   CEPHALEXIN (KEFLEX) 500 MG CAPSULE    Take 500 mg by mouth 2 (two) times daily.   HYDRALAZINE (APRESOLINE) 25 MG TABLET    Take 1 tablet (25 mg total) by mouth 2 (two) times daily.   LOPERAMIDE (IMODIUM) 2 MG CAPSULE    Take 2 mg by mouth daily.   METOPROLOL (LOPRESSOR)  100 MG TABLET    Take 100 mg by mouth daily.   NUTRITIONAL SUPPLEMENTS (NUTRITIONAL SUPPLEMENT PO)    Take by mouth. House 2.0 - give 237 ml two times daily   NUTRITIONAL SUPPLEMENTS (NUTRITIONAL SUPPLEMENT PO)    Nutritional Treat with meals for weight support   RANITIDINE (ZANTAC) 150 MG TABLET    Take 150 mg by mouth at bedtime.   SACCHAROMYCES BOULARDII (FLORASTOR) 250 MG CAPSULE    Take 250 mg by mouth 2 (two) times daily.   TAMSULOSIN HCL (FLOMAX) 0.4 MG CAPS    Take 1 capsule (0.4 mg total) by mouth daily.  Modified Medications   No medications on file  Discontinued Medications   No medications on file     SIGNIFICANT DIAGNOSTIC EXAMS  09-28-16: renal ultrasound: 1. Bilateral hydronephrosis, at least moderate in degree bilaterally. Consider CT for further characterization. 2. Bladder decompressed by Foley catheter. Questionable bladder wall thickening.   LABS REVIEWED:   09-27-16: wbc 13.2; hgb 11.7; hct 33.3; mcv 84.7; plt 185; glucose 116; bun 176; creat 24.64; k+ 6.4; na++ 130;  liver normal albumin 3.5: urine culture: no growth 09-29-16; wbc 12.1; hgb 10.7; hct 31.3; mcv 87.4; plt 155; glucose 107; bun 50; creat 2.44; k+ 3.1; na++ 139; liver normal albumin 2.9 10-01-16: glucose 131; bun 14; creat 1.17; k+ 3.8; na++ 131; (creat clear 30.51) mag 1.8    Review of Systems  Unable to perform ROS: Other (deaf; mute; blind )    Physical Exam  Constitutional: No distress.  Frail   Eyes: Conjunctivae are normal.  Neck: Neck supple. No JVD present. No thyromegaly present.  Cardiovascular: Normal rate, normal heart sounds and intact distal pulses.   Heart rate irregular   Respiratory: Effort normal and breath sounds normal. No respiratory distress. He has no wheezes.  GI: Soft. Bowel sounds are normal. He exhibits no distension. There is no tenderness.  Genitourinary:  Genitourinary Comments: Foley Penis is swollen worse on the shaft of the penis    Musculoskeletal: He exhibits no  edema.  Able to move all extremities   Lymphadenopathy:    He has no cervical adenopathy.  Neurological: He is alert.  Is aware of his surroundings   Skin: Skin is warm and dry. He is not diaphoretic.    ASSESSMENT/ PLAN:  1. Penile edema: will have staff elevate penis; his urology appointment is pending. He is on tthe cancellation list for  Urology   MD is aware of resident's narcotic use and is in agreement with current plan of care. We will attempt to wean resident as apropriate     Ok Edwards NP [facility]  Contact [phone] Monday through Friday 8am- 5pm  After hours call [phone]

## 2016-11-26 ENCOUNTER — Encounter: Payer: Self-pay | Admitting: Adult Health

## 2016-11-26 ENCOUNTER — Non-Acute Institutional Stay (SKILLED_NURSING_FACILITY): Payer: Medicare Other | Admitting: Adult Health

## 2016-11-26 DIAGNOSIS — S3994XA Unspecified injury of external genitals, initial encounter: Secondary | ICD-10-CM | POA: Diagnosis not present

## 2016-11-26 DIAGNOSIS — N4889 Other specified disorders of penis: Secondary | ICD-10-CM | POA: Diagnosis not present

## 2016-11-26 DIAGNOSIS — R338 Other retention of urine: Secondary | ICD-10-CM | POA: Diagnosis not present

## 2016-11-26 NOTE — Progress Notes (Signed)
Location:   [facility] Room Number: 102 A Place of Service:  SNF (31)   CODE STATUS: DNR  Allergies  Allergen Reactions  . Aspirin Other (See Comments)    Reaction:  Unknown     Chief Complaint  Patient presents with  . Acute Visit    Penile Tear    HPI:  Staff reports that he has a penile tear. He has a chronic foley in place due to his urinary retention. His penis remains swollen and his urology appointment is pending. He is unable to participate in the hpi or ros.    Past Medical History:  Diagnosis Date  . Blind   . CAD (coronary artery disease)   . Chronic kidney disease   . Deaf   . GERD (gastroesophageal reflux disease)   . Hyperlipidemia   . Hypertension   . Malignant neoplasm of kidney ([place])   . Mute     Past Surgical History:  Procedure Laterality Date  . CHOLECYSTECTOMY    . left partial nephrectomy      Social History   Social History  . Marital status: Single    Spouse name: N/A  . Number of children: N/A  . Years of education: N/A   Occupational History  . Not on file.   Social History Main Topics  . Smoking status: Never Smoker  . Smokeless tobacco: Never Used  . Alcohol use Not on file  . Drug use: Unknown  . Sexual activity: Not on file   Other Topics Concern  . Not on file   Social History Narrative  . No narrative on file   History reviewed. No pertinent family history.    VITAL SIGNS BP (!) 158/70   Pulse 80   Temp 98.1 F (36.7 C)   Resp 16   Ht 5\' 1"  (1.549 m)   Wt 118 lb 10 oz (53.8 kg)   SpO2 97%   BMI 22.41 kg/m   Patient's Medications  New Prescriptions   No medications on file  Previous Medications   AMLODIPINE-BENAZEPRIL (LOTREL) 10-20 MG PER CAPSULE    Take 1 capsule by mouth daily.   HYDRALAZINE (APRESOLINE) 25 MG TABLET    Take 1 tablet (25 mg total) by mouth 2 (two) times daily.   LOPERAMIDE (IMODIUM) 2 MG CAPSULE    Take 2 mg by mouth daily.   METOPROLOL (LOPRESSOR) 100 MG TABLET     Take 100 mg by mouth daily.   NUTRITIONAL SUPPLEMENTS (NUTRITIONAL SUPPLEMENT PO)    Take by mouth. House 2.0 - give 237 ml two times daily   NUTRITIONAL SUPPLEMENTS (NUTRITIONAL SUPPLEMENT PO)    Nutritional Treat with meals for weight support   RANITIDINE (ZANTAC) 150 MG TABLET    Take 150 mg by mouth at bedtime.   TAMSULOSIN HCL (FLOMAX) 0.4 MG CAPS    Take 1 capsule (0.4 mg total) by mouth daily.  Modified Medications   No medications on file  Discontinued Medications   No medications on file     SIGNIFICANT DIAGNOSTIC EXAMS  09-28-16: renal ultrasound: 1. Bilateral hydronephrosis, at least moderate in degree bilaterally. Consider CT for further characterization. 2. Bladder decompressed by Foley catheter. Questionable bladder wall thickening.   LABS REVIEWED:   09-27-16: wbc 13.2; hgb 11.7; hct 33.3; mcv 84.7; plt 185; glucose 116; bun 176; creat 24.64; k+ 6.4; na++ 130; liver normal albumin 3.5: urine culture: no growth 09-29-16; wbc 12.1; hgb 10.7; hct 31.3; mcv 87.4; plt 155; glucose  107; bun 50; creat 2.44; k+ 3.1; na++ 139; liver normal albumin 2.9 10-01-16: glucose 131; bun 14; creat 1.17; k+ 3.8; na++ 131; (creat clear 30.51) mag 1.8    Review of Systems  Unable to perform ROS: Other (deaf; mute; blind )    Physical Exam  Constitutional: No distress.  Frail   Eyes: Conjunctivae are normal.  Neck: Neck supple. No JVD present. No thyromegaly present.  Cardiovascular: Normal rate, normal heart sounds and intact distal pulses.   Heart rate irregular   Respiratory: Effort normal and breath sounds normal. No respiratory distress. He has no wheezes.  GI: Soft. Bowel sounds are normal. He exhibits no distension. There is no tenderness.  Genitourinary:  Genitourinary Comments: Foley Penis is swollen worse on the shaft of the penis He has a 1/2"inch penile tear on the posterior side.     Musculoskeletal: He exhibits no edema.  Able to move all extremities   Lymphadenopathy:     He has no cervical adenopathy.  Neurological: He is alert.  Is aware of his surroundings   Skin: Skin is warm and dry. He is not diaphoretic.    ASSESSMENT/ PLAN:  1. Penile edema:  2. Penile tear:  3. Urine retention His foley tubing was taut will have staff loosen tubing the foley is secured to his leg Will use steri-strips to his tear He is awaiting urology appointment Will monitor    MD is aware of resident's narcotic use and is in agreement with current plan of care. We will attempt to wean resident as apropriate     Ok Edwards NP [facility]  Contact [phone] Monday through Friday 8am- 5pm  After hours call [phone]

## 2016-11-28 ENCOUNTER — Encounter: Payer: Self-pay | Admitting: Internal Medicine

## 2016-11-28 ENCOUNTER — Non-Acute Institutional Stay (SKILLED_NURSING_FACILITY): Payer: Medicare Other | Admitting: Internal Medicine

## 2016-11-28 DIAGNOSIS — N183 Chronic kidney disease, stage 3 unspecified: Secondary | ICD-10-CM

## 2016-11-28 DIAGNOSIS — I25119 Atherosclerotic heart disease of native coronary artery with unspecified angina pectoris: Secondary | ICD-10-CM | POA: Diagnosis not present

## 2016-11-28 DIAGNOSIS — Z978 Presence of other specified devices: Secondary | ICD-10-CM

## 2016-11-28 DIAGNOSIS — I209 Angina pectoris, unspecified: Secondary | ICD-10-CM

## 2016-11-28 DIAGNOSIS — N401 Enlarged prostate with lower urinary tract symptoms: Secondary | ICD-10-CM

## 2016-11-28 DIAGNOSIS — I10 Essential (primary) hypertension: Secondary | ICD-10-CM | POA: Diagnosis not present

## 2016-11-28 DIAGNOSIS — H913 Deaf nonspeaking, not elsewhere classified: Secondary | ICD-10-CM

## 2016-11-28 DIAGNOSIS — Z960 Presence of urogenital implants: Secondary | ICD-10-CM

## 2016-11-28 DIAGNOSIS — Z85528 Personal history of other malignant neoplasm of kidney: Secondary | ICD-10-CM

## 2016-11-28 DIAGNOSIS — N138 Other obstructive and reflux uropathy: Secondary | ICD-10-CM | POA: Diagnosis not present

## 2016-11-28 DIAGNOSIS — Z9289 Personal history of other medical treatment: Secondary | ICD-10-CM

## 2016-11-28 NOTE — Progress Notes (Signed)
Patient ID: JAYDE DAFFIN, male   DOB: May 19, 1930, 83 y.o.   MRN: [id number]    DATE:  11/28/2016  Location:    [facility] Room Number: 102 A  Place of Service: SNF (31)   Extended Emergency Contact Information Primary Emergency Contact: Leblond,[name] Address: [address]          [place], [place] [address] [place] Phone: [phone] Relation: Other Secondary Emergency Contact: [name] Address: [address]          [place], [place] [address] [place] Phone: [phone] Relation: Niece  Advanced Directive information Does Patient Have a Medical Advance Directive?: Yes, Would patient like information on creating a medical advance directive?: No - Patient declined, Type of Advance Directive: Out of facility DNR (pink MOST or yellow form), Pre-existing out of facility DNR order (yellow form or pink MOST form): Yellow form placed in chart (order not valid for inpatient use);Pink MOST form placed in chart (order not valid for inpatient use), Does patient want to make changes to medical advance directive?: No - Patient declined  Chief Complaint  Patient presents with  . Medical Management of Chronic Issues    Routine Visit    HPI:  83 yo male long term resident seen today for f/u. He has no concerns. He is blind and mute and unable to provide any further information. Hx obtained from chart.  Hypertension - stable on lotrel 10/20 mg daily; apresoline 25 mg twice daily; lopressor 100 mg daily.   CAD - stable. No CP. He takes lopressor 100 mg daily   GERD - stable on zantac 150 mg daily   Chronic diarrhea - stable on imodium 2 mg daily   BPH with urinary retention - s/p foley cath. He takes flomax 0.4 mg daily   CKD - stage 3. Stable. He is s/p partial left nephrectomy due to malignant neoplasm. Cr 1.0   Past Medical History:  Diagnosis Date  . Blind   . CAD (coronary artery disease)   . Chronic kidney disease   . Deaf     . GERD (gastroesophageal reflux disease)   . Hyperlipidemia   . Hypertension   . Malignant neoplasm of kidney ([place])   . Mute     Past Surgical History:  Procedure Laterality Date  . CHOLECYSTECTOMY    . left partial nephrectomy      Patient Care Team: Lin Landsman, MD as PCP - General (Family Medicine)  Social History   Social History  . Marital status: Single    Spouse name: N/A  . Number of children: N/A  . Years of education: N/A   Occupational History  . Not on file.   Social History Main Topics  . Smoking status: Never Smoker  . Smokeless tobacco: Never Used  . Alcohol use Not on file  . Drug use: Unknown  . Sexual activity: Not on file   Other Topics Concern  . Not on file   Social History Narrative  . No narrative on file     reports that he has never smoked. He has never used smokeless tobacco. His alcohol and drug histories are not on file.  History reviewed. No pertinent family history. No family status information on file.    Immunization History  Administered Date(s) Administered  . Influenza Split 08/19/2011  . PPD Test 10/17/2016    Allergies  Allergen Reactions  . Aspirin Other (See Comments)    Reaction:  Unknown     Medications: Patient's Medications  New Prescriptions   No medications on file  Previous Medications   AMLODIPINE-BENAZEPRIL (LOTREL) 10-20 MG PER CAPSULE    Take 1 capsule by mouth daily.   HYDRALAZINE (APRESOLINE) 25 MG TABLET    Take 1 tablet (25 mg total) by mouth 2 (two) times daily.   LOPERAMIDE (IMODIUM) 2 MG CAPSULE    Take 2 mg by mouth daily.   METOPROLOL (LOPRESSOR) 100 MG TABLET    Take 100 mg by mouth daily.   NUTRITIONAL SUPPLEMENTS (NUTRITIONAL SUPPLEMENT PO)    Take by mouth. House 2.0 - give 237 ml two times daily   NUTRITIONAL SUPPLEMENTS (NUTRITIONAL SUPPLEMENT PO)    Nutritional Treat with meals for weight support   RANITIDINE (ZANTAC) 150 MG TABLET    Take 150 mg by mouth at bedtime.    TAMSULOSIN HCL (FLOMAX) 0.4 MG CAPS    Take 1 capsule (0.4 mg total) by mouth daily.  Modified Medications   No medications on file  Discontinued Medications   No medications on file    Review of Systems  Unable to perform ROS: Patient nonverbal (mute and blind)    Vitals:   11/28/16 1115  BP: 131/66  Pulse: 80  Resp: 18  Temp: 98.5 F (36.9 C)  TempSrc: Oral  SpO2: 98%  Weight: 101 lb 12.8 oz (46.2 kg)   Body mass index is 19.23 kg/m.  Physical Exam  Constitutional: He appears well-developed.  Sitting in w/c, frail appearing  HENT:  Mouth/Throat: Oropharynx is clear and moist.  MMM; no oral thrush  Eyes: Pupils are equal, round, and reactive to light. No scleral icterus.  Neck: Neck supple. Carotid bruit is not present. No thyromegaly present.  Cardiovascular: Normal rate, regular rhythm and intact distal pulses.  Exam reveals no gallop and no friction rub.   Murmur (1/6 SEM) heard. Trace LE edema b/l. No calf TTP  Pulmonary/Chest: Effort normal and breath sounds normal. He has no wheezes. He has no rales. He exhibits no tenderness.  Abdominal: Soft. Normal appearance and bowel sounds are normal. He exhibits no distension, no abdominal bruit, no pulsatile midline mass and no mass. There is no hepatomegaly. There is no tenderness. There is no rigidity, no rebound and no guarding. No hernia.  Genitourinary:  Genitourinary Comments: Foley cath intact and DTG clear yellow urine  Lymphadenopathy:    He has no cervical adenopathy.  Neurological: He is alert.  Skin: Skin is warm and dry. No rash noted.  Psychiatric: He has a normal mood and affect. His behavior is normal.     Labs reviewed: Abstract on 10/18/2016  Component Date Value Ref Range Status  . Hemoglobin 10/16/2016 12.2* 13.5 - 17.5 g/dL Final  . HCT 10/16/2016 36* 41 - 53 % Final  . Neutrophils Absolute 10/16/2016 6  /L Final  . Platelets 10/16/2016 340  150 - 399 K/L Final  . WBC 10/16/2016 9.9  10^3/mL  Final  . Glucose 10/16/2016 89  mg/dL Final  . BUN 10/16/2016 16  4 - 21 mg/dL Final  . Creatinine 10/16/2016 1.0  0.6 - 1.3 mg/dL Final  . Potassium 10/16/2016 3.8  3.4 - 5.3 mmol/L Final  . Sodium 10/16/2016 136* 137 - 147 mmol/L Final  Admission on 09/27/2016, Discharged on 10/01/2016  Component Date Value Ref Range Status  . WBC 09/27/2016 13.2* 4.0 - 10.5 K/uL Final  . RBC 09/27/2016 3.93* 4.22 - 5.81 MIL/uL Final  . Hemoglobin 09/27/2016  11.7* 13.0 - 17.0 g/dL Final  . HCT 09/27/2016 33.3* 39.0 - 52.0 % Final  . MCV 09/27/2016 84.7  78.0 - 100.0 fL Final  . MCH 09/27/2016 29.8  26.0 - 34.0 pg Final  . MCHC 09/27/2016 35.1  30.0 - 36.0 g/dL Final  . RDW 09/27/2016 12.8  11.5 - 15.5 % Final  . Platelets 09/27/2016 185  150 - 400 K/uL Final  . Sodium 09/27/2016 130* 135 - 145 mmol/L Final  . Potassium 09/27/2016 6.4* 3.5 - 5.1 mmol/L Final   Comment: CRITICAL RESULT CALLED TO, READ BACK BY AND VERIFIED WITH: TALKINGTON,J. RN _0  ON 04.06.18 BY COHEN,K   . Chloride 09/27/2016 89* 101 - 111 mmol/L Final  . CO2 09/27/2016 16* 22 - 32 mmol/L Final  . Glucose, Bld 09/27/2016 116* 65 - 99 mg/dL Final  . BUN 09/27/2016 176* 6 - 20 mg/dL Final   RESULTS CONFIRMED BY MANUAL DILUTION  . Creatinine, Ser 09/27/2016 24.64* 0.61 - 1.24 mg/dL Final  . Calcium 09/27/2016 8.8* 8.9 - 10.3 mg/dL Final  . Total Protein 09/27/2016 6.9  6.5 - 8.1 g/dL Final  . Albumin 09/27/2016 3.5  3.5 - 5.0 g/dL Final  . AST 09/27/2016 25  15 - 41 U/L Final  . ALT 09/27/2016 23  17 - 63 U/L Final  . Alkaline Phosphatase 09/27/2016 55  38 - 126 U/L Final  . Total Bilirubin 09/27/2016 0.6  0.3 - 1.2 mg/dL Final  . GFR calc non Af Amer 09/27/2016 1* >60 mL/min Final  . GFR calc Af Amer 09/27/2016 2* >60 mL/min Final   Comment: (NOTE) The eGFR has been calculated using the CKD EPI equation. This calculation has not been validated in all clinical situations. eGFR's persistently <60 mL/min signify possible  Chronic Kidney Disease.   . Anion gap 09/27/2016 25* 5 - 15 Final  . Color, Urine 09/27/2016 YELLOW  YELLOW Final  . APPearance 09/27/2016 HAZY* CLEAR Final  . Specific Gravity, Urine 09/27/2016 1.010  1.005 - 1.030 Final  . pH 09/27/2016 6.0  5.0 - 8.0 Final  . Glucose, UA 09/27/2016 NEGATIVE  NEGATIVE mg/dL Final  . Hgb urine dipstick 09/27/2016 LARGE* NEGATIVE Final  . Bilirubin Urine 09/27/2016 NEGATIVE  NEGATIVE Final  . Ketones, ur 09/27/2016 NEGATIVE  NEGATIVE mg/dL Final  . Protein, ur 09/27/2016 100* NEGATIVE mg/dL Final  . Nitrite 09/27/2016 NEGATIVE  NEGATIVE Final  . Leukocytes, UA 09/27/2016 LARGE* NEGATIVE Final  . RBC / HPF 09/27/2016 TOO NUMEROUS TO COUNT  0 - 5 RBC/hpf Final  . WBC, UA 09/27/2016 TOO NUMEROUS TO COUNT  0 - 5 WBC/hpf Final  . Bacteria, UA 09/27/2016 RARE* NONE SEEN Final  . Squamous Epithelial / LPF 09/27/2016 NONE SEEN  NONE SEEN Final  . Specimen Description 09/27/2016 URINE, RANDOM   Final  . Special Requests 09/27/2016 NONE   Final  . Culture 09/27/2016    Final                   Value:NO GROWTH Performed at [facility], [place] [address] [place], [place] [address]   . Report Status 09/27/2016 09/29/2016 FINAL   Final  . Sodium 09/28/2016 135  135 - 145 mmol/L Final  . Potassium 09/28/2016 3.5  3.5 - 5.1 mmol/L Final   DELTA CHECK NOTED  . Chloride 09/28/2016 99* 101 - 111 mmol/L Final  . CO2 09/28/2016 19* 22 - 32 mmol/L Final  . Glucose, Bld 09/28/2016 153* 65 - 99 mg/dL  Final  . BUN 09/28/2016 116* 6 - 20 mg/dL Final   RESULTS CONFIRMED BY MANUAL DILUTION  . Creatinine, Ser 09/28/2016 10.48* 0.61 - 1.24 mg/dL Final  . Calcium 09/28/2016 9.0  8.9 - 10.3 mg/dL Final  . Total Protein 09/28/2016 5.9* 6.5 - 8.1 g/dL Final  . Albumin 09/28/2016 2.8* 3.5 - 5.0 g/dL Final  . AST 09/28/2016 25  15 - 41 U/L Final  . ALT 09/28/2016 22  17 - 63 U/L Final  . Alkaline Phosphatase 09/28/2016 44  38 - 126 U/L Final  . Total Bilirubin 09/28/2016  1.1  0.3 - 1.2 mg/dL Final  . GFR calc non Af Amer 09/28/2016 4* >60 mL/min Final  . GFR calc Af Amer 09/28/2016 4* >60 mL/min Final   Comment: (NOTE) The eGFR has been calculated using the CKD EPI equation. This calculation has not been validated in all clinical situations. eGFR's persistently <60 mL/min signify possible Chronic Kidney Disease.   . Anion gap 09/28/2016 17* 5 - 15 Final  . WBC 09/28/2016 9.8  4.0 - 10.5 K/uL Final  . RBC 09/28/2016 3.55* 4.22 - 5.81 MIL/uL Final  . Hemoglobin 09/28/2016 10.6* 13.0 - 17.0 g/dL Final  . HCT 09/28/2016 30.2* 39.0 - 52.0 % Final  . MCV 09/28/2016 85.1  78.0 - 100.0 fL Final  . MCH 09/28/2016 29.9  26.0 - 34.0 pg Final  . MCHC 09/28/2016 35.1  30.0 - 36.0 g/dL Final  . RDW 09/28/2016 12.8  11.5 - 15.5 % Final  . Platelets 09/28/2016 166  150 - 400 K/uL Final  . MRSA by PCR 09/27/2016 NEGATIVE  NEGATIVE Final   Comment:        The GeneXpert MRSA Assay (FDA approved for NASAL specimens only), is one component of a comprehensive MRSA colonization surveillance program. It is not intended to diagnose MRSA infection nor to guide or monitor treatment for MRSA infections.   . WBC 09/29/2016 12.1* 4.0 - 10.5 K/uL Final  . RBC 09/29/2016 3.58* 4.22 - 5.81 MIL/uL Final  . Hemoglobin 09/29/2016 10.7* 13.0 - 17.0 g/dL Final  . HCT 09/29/2016 31.3* 39.0 - 52.0 % Final  . MCV 09/29/2016 87.4  78.0 - 100.0 fL Final  . MCH 09/29/2016 29.9  26.0 - 34.0 pg Final  . MCHC 09/29/2016 34.2  30.0 - 36.0 g/dL Final  . RDW 09/29/2016 12.7  11.5 - 15.5 % Final  . Platelets 09/29/2016 155  150 - 400 K/uL Final  . Neutrophils Relative % 09/29/2016 78  % Final  . Neutro Abs 09/29/2016 9.5* 1.7 - 7.7 K/uL Final  . Lymphocytes Relative 09/29/2016 10  % Final  . Lymphs Abs 09/29/2016 1.2  0.7 - 4.0 K/uL Final  . Monocytes Relative 09/29/2016 11  % Final  . Monocytes Absolute 09/29/2016 1.3* 0.1 - 1.0 K/uL Final  . Eosinophils Relative 09/29/2016 1  % Final   . Eosinophils Absolute 09/29/2016 0.1  0.0 - 0.7 K/uL Final  . Basophils Relative 09/29/2016 0  % Final  . Basophils Absolute 09/29/2016 0.0  0.0 - 0.1 K/uL Final  . Sodium 09/29/2016 139  135 - 145 mmol/L Final  . Potassium 09/29/2016 3.1* 3.5 - 5.1 mmol/L Final  . Chloride 09/29/2016 103  101 - 111 mmol/L Final  . CO2 09/29/2016 28  22 - 32 mmol/L Final  . Glucose, Bld 09/29/2016 107* 65 - 99 mg/dL Final  . BUN 09/29/2016 50* 6 - 20 mg/dL Final  . Creatinine, Ser 09/29/2016 2.44* 0.61 -  1.24 mg/dL Final   Comment: DELTA CHECK NOTED REPEATED TO VERIFY   . Calcium 09/29/2016 8.9  8.9 - 10.3 mg/dL Final  . Total Protein 09/29/2016 6.0* 6.5 - 8.1 g/dL Final  . Albumin 09/29/2016 2.9* 3.5 - 5.0 g/dL Final  . AST 09/29/2016 27  15 - 41 U/L Final  . ALT 09/29/2016 22  17 - 63 U/L Final  . Alkaline Phosphatase 09/29/2016 44  38 - 126 U/L Final  . Total Bilirubin 09/29/2016 1.0  0.3 - 1.2 mg/dL Final  . GFR calc non Af Amer 09/29/2016 23* >60 mL/min Final  . GFR calc Af Amer 09/29/2016 26* >60 mL/min Final   Comment: (NOTE) The eGFR has been calculated using the CKD EPI equation. This calculation has not been validated in all clinical situations. eGFR's persistently <60 mL/min signify possible Chronic Kidney Disease.   . Anion gap 09/29/2016 8  5 - 15 Final  . Sodium 09/30/2016 136  135 - 145 mmol/L Final  . Potassium 09/30/2016 3.7  3.5 - 5.1 mmol/L Final  . Chloride 09/30/2016 104  101 - 111 mmol/L Final  . CO2 09/30/2016 26  22 - 32 mmol/L Final  . Glucose, Bld 09/30/2016 97  65 - 99 mg/dL Final  . BUN 09/30/2016 19  6 - 20 mg/dL Final  . Creatinine, Ser 09/30/2016 1.21  0.61 - 1.24 mg/dL Final   Comment: DELTA CHECK NOTED REPEATED TO VERIFY   . Calcium 09/30/2016 8.5* 8.9 - 10.3 mg/dL Final  . GFR calc non Af Amer 09/30/2016 53* >60 mL/min Final  . GFR calc Af Amer 09/30/2016 >60  >60 mL/min Final   Comment: (NOTE) The eGFR has been calculated using the CKD EPI  equation. This calculation has not been validated in all clinical situations. eGFR's persistently <60 mL/min signify possible Chronic Kidney Disease.   . Anion gap 09/30/2016 6  5 - 15 Final  . WBC 09/30/2016 12.2* 4.0 - 10.5 K/uL Final  . RBC 09/30/2016 3.44* 4.22 - 5.81 MIL/uL Final  . Hemoglobin 09/30/2016 10.5* 13.0 - 17.0 g/dL Final  . HCT 09/30/2016 30.4* 39.0 - 52.0 % Final  . MCV 09/30/2016 88.4  78.0 - 100.0 fL Final  . MCH 09/30/2016 30.5  26.0 - 34.0 pg Final  . MCHC 09/30/2016 34.5  30.0 - 36.0 g/dL Final  . RDW 09/30/2016 12.7  11.5 - 15.5 % Final  . Platelets 09/30/2016 156  150 - 400 K/uL Final  . Sodium 09/30/2016 133* 135 - 145 mmol/L Final  . Potassium 09/30/2016 4.2  3.5 - 5.1 mmol/L Final  . Chloride 09/30/2016 102  101 - 111 mmol/L Final  . CO2 09/30/2016 23  22 - 32 mmol/L Final  . Glucose, Bld 09/30/2016 168* 65 - 99 mg/dL Final  . BUN 09/30/2016 17  6 - 20 mg/dL Final  . Creatinine, Ser 09/30/2016 1.24  0.61 - 1.24 mg/dL Final  . Calcium 09/30/2016 8.5* 8.9 - 10.3 mg/dL Final  . Total Protein 09/30/2016 6.1* 6.5 - 8.1 g/dL Final  . Albumin 09/30/2016 2.8* 3.5 - 5.0 g/dL Final  . AST 09/30/2016 33  15 - 41 U/L Final  . ALT 09/30/2016 21  17 - 63 U/L Final  . Alkaline Phosphatase 09/30/2016 45  38 - 126 U/L Final  . Total Bilirubin 09/30/2016 0.6  0.3 - 1.2 mg/dL Final  . GFR calc non Af Amer 09/30/2016 51* >60 mL/min Final  . GFR calc Af Amer 09/30/2016 59* >60 mL/min Final  Comment: (NOTE) The eGFR has been calculated using the CKD EPI equation. This calculation has not been validated in all clinical situations. eGFR's persistently <60 mL/min signify possible Chronic Kidney Disease.   . Anion gap 09/30/2016 8  5 - 15 Final  . Magnesium 09/30/2016 1.3* 1.7 - 2.4 mg/dL Final  . Sodium 10/01/2016 131* 135 - 145 mmol/L Final  . Potassium 10/01/2016 3.8  3.5 - 5.1 mmol/L Final  . Chloride 10/01/2016 101  101 - 111 mmol/L Final  . CO2 10/01/2016 23  22 -  32 mmol/L Final  . Glucose, Bld 10/01/2016 99  65 - 99 mg/dL Final  . BUN 10/01/2016 14  6 - 20 mg/dL Final  . Creatinine, Ser 10/01/2016 1.17  0.61 - 1.24 mg/dL Final  . Calcium 10/01/2016 8.0* 8.9 - 10.3 mg/dL Final  . GFR calc non Af Amer 10/01/2016 55* >60 mL/min Final  . GFR calc Af Amer 10/01/2016 >60  >60 mL/min Final   Comment: (NOTE) The eGFR has been calculated using the CKD EPI equation. This calculation has not been validated in all clinical situations. eGFR's persistently <60 mL/min signify possible Chronic Kidney Disease.   . Anion gap 10/01/2016 7  5 - 15 Final  . Magnesium 10/01/2016 1.8  1.7 - 2.4 mg/dL Final    No results found.   Assessment/Plan   ICD-10-CM   1. BPH with urinary obstruction N40.1    N13.8   2. Essential hypertension, benign I10   3. CKD (chronic kidney disease), stage III N18.3   4. Foley catheter in place Z92.89   5. Atherosclerosis of native coronary artery of native heart with angina pectoris ([place]) I25.119   6. 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 Personal history of renal cancer Z85.528    Cont nutritional supplements as ordered  Check CMP  Foley cath care as indicated  F/u with urology as scheduled  PT/OT/ST as indicated  Cont current meds as ordered  Will follow  Krista Som S. Perlie Gold  [facility] [address] [place], [place] [address] [phone] Cell (Monday-Friday 8 AM - 5 PM) [phone] After 5 PM and follow prompts

## 2016-12-17 DIAGNOSIS — S3994XD Unspecified injury of external genitals, subsequent encounter: Secondary | ICD-10-CM | POA: Insufficient documentation

## 2016-12-18 ENCOUNTER — Encounter (HOSPITAL_COMMUNITY): Payer: Self-pay | Admitting: Emergency Medicine

## 2016-12-18 ENCOUNTER — Emergency Department (HOSPITAL_COMMUNITY)
Admission: EM | Admit: 2016-12-18 | Discharge: 2016-12-18 | Disposition: A | Discharge status: Home / Self Care | Payer: Medicare Other | Attending: Emergency Medicine | Admitting: Emergency Medicine

## 2016-12-18 DIAGNOSIS — N390 Urinary tract infection, site not specified: Secondary | ICD-10-CM | POA: Insufficient documentation

## 2016-12-18 DIAGNOSIS — I2510 Atherosclerotic heart disease of native coronary artery without angina pectoris: Secondary | ICD-10-CM | POA: Diagnosis not present

## 2016-12-18 DIAGNOSIS — R310 Gross hematuria: Secondary | ICD-10-CM

## 2016-12-18 DIAGNOSIS — I129 Hypertensive chronic kidney disease with stage 1 through stage 4 chronic kidney disease, or unspecified chronic kidney disease: Secondary | ICD-10-CM | POA: Insufficient documentation

## 2016-12-18 DIAGNOSIS — Z79899 Other long term (current) drug therapy: Secondary | ICD-10-CM | POA: Insufficient documentation

## 2016-12-18 DIAGNOSIS — R319 Hematuria, unspecified: Secondary | ICD-10-CM | POA: Diagnosis present

## 2016-12-18 DIAGNOSIS — C649 Malignant neoplasm of unspecified kidney, except renal pelvis: Secondary | ICD-10-CM | POA: Diagnosis not present

## 2016-12-18 DIAGNOSIS — B999 Unspecified infectious disease: Secondary | ICD-10-CM | POA: Diagnosis not present

## 2016-12-18 DIAGNOSIS — N183 Chronic kidney disease, stage 3 (moderate): Secondary | ICD-10-CM | POA: Insufficient documentation

## 2016-12-18 DIAGNOSIS — T83198A Other mechanical complication of other urinary devices and implants, initial encounter: Secondary | ICD-10-CM | POA: Diagnosis not present

## 2016-12-18 DIAGNOSIS — R4182 Altered mental status, unspecified: Secondary | ICD-10-CM | POA: Insufficient documentation

## 2016-12-18 DIAGNOSIS — T83498A Other mechanical complication of other prosthetic devices, implants and grafts of genital tract, initial encounter: Secondary | ICD-10-CM | POA: Diagnosis not present

## 2016-12-18 HISTORY — DX: Hyperkalemia: E87.5

## 2016-12-18 HISTORY — DX: Benign prostatic hyperplasia with lower urinary tract symptoms: N40.1

## 2016-12-18 HISTORY — DX: Anemia, unspecified: D64.9

## 2016-12-18 HISTORY — DX: Adult failure to thrive: R62.7

## 2016-12-18 HISTORY — DX: Urinary tract infection, site not specified: N39.0

## 2016-12-18 LAB — URINALYSIS, ROUTINE W REFLEX MICROSCOPIC
Bilirubin Urine: NEGATIVE
Glucose, UA: NEGATIVE mg/dL
Ketones, ur: NEGATIVE mg/dL
NITRITE: POSITIVE — AB
PROTEIN: NEGATIVE mg/dL
SPECIFIC GRAVITY, URINE: 1.015 (ref 1.005–1.030)
pH: 6 (ref 5.0–8.0)

## 2016-12-18 LAB — I-STAT CHEM 8, ED
BUN: 17 mg/dL (ref 6–20)
CALCIUM ION: 1.15 mmol/L (ref 1.15–1.40)
CHLORIDE: 104 mmol/L (ref 101–111)
Creatinine, Ser: 1 mg/dL (ref 0.61–1.24)
Glucose, Bld: 92 mg/dL (ref 65–99)
HEMATOCRIT: 35 % — AB (ref 39.0–52.0)
Hemoglobin: 11.9 g/dL — ABNORMAL LOW (ref 13.0–17.0)
Potassium: 3.6 mmol/L (ref 3.5–5.1)
SODIUM: 139 mmol/L (ref 135–145)
TCO2: 22 mmol/L (ref 0–100)

## 2016-12-18 LAB — URINALYSIS, MICROSCOPIC (REFLEX)

## 2016-12-18 MED ORDER — CEPHALEXIN 500 MG PO CAPS: 500.0000 mg | ORAL_CAPSULE | Freq: Three times a day (TID) | ORAL | 0 refills | Status: DC

## 2016-12-18 MED ORDER — CEFTRIAXONE SODIUM 1 G IJ SOLR
1.0000 g | Freq: Once | INTRAMUSCULAR | Status: AC
  Administered 2016-12-18: 1 g via INTRAMUSCULAR
  Filled 2016-12-18: qty 10

## 2016-12-18 MED ORDER — LIDOCAINE HCL 2 % IJ SOLN
INTRAMUSCULAR | Status: AC
  Administered 2016-12-18: 400 mg
  Filled 2016-12-18: qty 20

## 2016-12-18 NOTE — ED Notes (Signed)
[facility] called for transportation back to [facility] and report given to [place], Therapist, sports

## 2016-12-18 NOTE — ED Notes (Signed)
Upon catheter assessment, drainage bag and tubing noted to be heavily soiled with sediment. Small clots noted in catheter tubing. Upon changing drainage bag, urine is yellow and cloudy with small amount of sediment. Catheter drainage bag changed and sample obtained.

## 2016-12-18 NOTE — ED Notes (Signed)
Bed: JG87 Expected date:  Expected time:  Means of arrival:  Comments: 83 yr old blood in catheter

## 2016-12-18 NOTE — Discharge Instructions (Signed)
He was seen today for blood in her Foley. He may have the beginnings of urinary tract infection. Urine culture is pending. He'll be started on antibiotics. If he develop fevers or any new worsening symptoms, he should be re-evaluated immediately.

## 2016-12-18 NOTE — ED Triage Notes (Signed)
Pt BIB EMS from [facility] for blood in catheter x2 days. Pt in NAD, no grimaces when moving the patient. Pt is blind, deaf and mute, but is at baseline per [employer] staff.

## 2016-12-18 NOTE — ED Notes (Signed)
[facility] here for transportation back to facility

## 2016-12-18 NOTE — ED Provider Notes (Signed)
[facility] DEPT Provider Note   CSN: [id number] Arrival date & time: 12/18/16  0038   By signing my name below, I, Eunice Blase, attest that this documentation has been prepared under the direction and in the presence of Horton, Barbette Hair, MD. Electronically signed, Eunice Blase, ED Scribe. 12/18/16. 1:04 AM.   History   Chief Complaint Chief Complaint  Patient presents with  . Hematuria   LEVEL 5 CAVEAT: HPI and ROS limited due to blindness/ deafness.   The history is provided by the patient and medical records. No language interpreter was used.    Alexander Rosales is a 83 y.o. male with h/o bilateral blindness, deaf mutism, UTI, prostatic hyperplasia and L partial nephrectomy BIB EMS from [place] to the Emergency Department concerning blood in the pt's catheter x 2 days. Pt non reactive when moved, per triage. [facility] reported pt at baseline for EMS personnel. Unable to obtain further historical details.  Past Medical History:  Diagnosis Date  . Adult failure to thrive   . Anemia, unspecified   . Benign prostatic hyperplasia with lower urinary tract symptoms 10/01/2016  . Blind   . CAD (coronary artery disease)   . Chronic kidney disease   . Deaf   . GERD (gastroesophageal reflux disease)   . Hyperkalemia   . Hyperlipidemia   . Hypertension   . Malignant neoplasm of kidney ([place])   . Mute   . Urinary tract infection, site not specified     Patient Active Problem List   Diagnosis Date Noted  . Penis injury, initial encounter 12/17/2016  . Penile edema 11/06/2016  . Cellulitis of penis 10/31/2016  . Hypertensive kidney disease with CKD stage III 10/02/2016  . CKD (chronic kidney disease), stage III 10/02/2016  . BPH with urinary obstruction 10/02/2016  . Acute retention of urine 10/02/2016  . Hyperkalemia 09/27/2016  . Acute renal failure ([place]) 09/19/2011  . Back pain 08/20/2011  . Malignant neoplasm of kidney excluding renal pelvis ([place]) 02/07/2007  .  Blindness of both eyes 04/10/2006  . Deaf mutism, acquired 04/10/2006  . Essential hypertension, benign 04/10/2006  . Coronary atherosclerosis 04/10/2006  . GERD without esophagitis 04/10/2006    Past Surgical History:  Procedure Laterality Date  . CHOLECYSTECTOMY    . left partial nephrectomy         Home Medications    Prior to Admission medications   Medication Sig Start Date End Date Taking? Authorizing Provider  amLODipine-benazepril (LOTREL) 10-20 MG per capsule Take 1 capsule by mouth daily. 08/19/11   Pedro Earls, MD  cephALEXin (KEFLEX) 500 MG capsule Take 1 capsule (500 mg total) by mouth 3 (three) times daily. 12/18/16   Horton, Barbette Hair, MD  hydrALAZINE (APRESOLINE) 25 MG tablet Take 1 tablet (25 mg total) by mouth 2 (two) times daily. 08/19/11   Pedro Earls, MD  loperamide (IMODIUM) 2 MG capsule Take 2 mg by mouth daily.    [provider]  metoprolol (LOPRESSOR) 100 MG tablet Take 100 mg by mouth daily.    [provider]  Nutritional Supplements (NUTRITIONAL SUPPLEMENT PO) Take by mouth. House 2.0 - give 237 ml two times daily    [provider]  Nutritional Supplements (NUTRITIONAL SUPPLEMENT PO) Nutritional Treat with meals for weight support    [provider]  ranitidine (ZANTAC) 150 MG tablet Take 150 mg by mouth at bedtime.    [provider]  Tamsulosin HCl (FLOMAX) 0.4 MG CAPS Take 1 capsule (0.4 mg total)  by mouth daily. 09/16/11   Pedro Earls, MD    Family History History reviewed. No pertinent family history.  Social History Social History  Substance Use Topics  . Smoking status: Never Smoker  . Smokeless tobacco: Never Used  . Alcohol use No     Allergies   Aspirin   Review of Systems Review of Systems  Unable to perform ROS: Patient nonverbal     Physical Exam Updated Vital Signs BP (!) 152/75 (BP Location: Left Arm)   Pulse 73   Temp 98.5 F (36.9 C) (Oral)   Resp 18   SpO2 99%    Physical Exam  Constitutional: No distress.  HENT:  Head: Normocephalic and atraumatic.  Eyes:  Both eyes close, hazy corneas bilaterally  Cardiovascular: Normal rate, regular rhythm and normal heart sounds.   No murmur heard. Pulmonary/Chest: Effort normal and breath sounds normal. No respiratory distress. He has no wheezes.  Abdominal: Soft. Bowel sounds are normal. There is no tenderness. There is no rebound and no guarding.  Genitourinary:  Genitourinary Comments: Edema noted to the head of the penis, circumcised, hypospadias, urinary catheter in place with blood-tinged urine  Musculoskeletal: He exhibits no edema.  Neurological:  Appears to move all 4 extremities without difficulty, facial symmetry noted  Skin: Skin is warm and dry.  Psychiatric:  Unable to assess  Nursing note and vitals reviewed.    ED Treatments / Results  DIAGNOSTIC STUDIES: Oxygen Saturation is 99% on RA, NL by my interpretation.     Labs (all labs ordered are listed, but only abnormal results are displayed) Labs Reviewed  URINALYSIS, ROUTINE W REFLEX MICROSCOPIC - Abnormal; Notable for the following:       Result Value   APPearance CLOUDY (*)    Hgb urine dipstick SMALL (*)    Nitrite POSITIVE (*)    Leukocytes, UA MODERATE (*)    All other components within normal limits  URINALYSIS, MICROSCOPIC (REFLEX) - Abnormal; Notable for the following:    Bacteria, UA MANY (*)    Squamous Epithelial / LPF 0-5 (*)    All other components within normal limits  I-STAT CHEM 8, ED - Abnormal; Notable for the following:    Hemoglobin 11.9 (*)    HCT 35.0 (*)    All other components within normal limits  URINE CULTURE    EKG  EKG Interpretation None       Radiology No results found.  Procedures Procedures (including critical care time)  Medications Ordered in ED Medications  cefTRIAXone (ROCEPHIN) injection 1 g (not administered)     Initial Impression / Assessment and Plan / ED Course   I have reviewed the triage vital signs and the nursing notes.  Pertinent labs & imaging results that were available during my care of the patient were reviewed by me and considered in my medical decision making (see chart for details).     Patient presents with hematuria. He does not provide any history. He is reportedly at his baseline. He is afebrile. Vital signs reassuring. No significant abnormalities on exam. He does have nitrite positive urine with 6-30 white cells and many bacteria. Urine culture sent. He has a chronic indwelling Foley. Chem-8 obtained. Kidney function reassuring. Will air on the side of treatment given his age and chronic Foley. Patient was given IM Rocephin. Will discharge on Keflex and await urine culture. No evidence of fever or systemic illness.  After history, exam, and medical workup I feel the patient has been  appropriately medically screened and is safe for discharge home. Pertinent diagnoses were discussed with the patient. Patient was given return precautions.   Final Clinical Impressions(s) / ED Diagnoses   Final diagnoses:  Gross hematuria  Lower urinary tract infectious disease    New Prescriptions New Prescriptions   CEPHALEXIN (KEFLEX) 500 MG CAPSULE    Take 1 capsule (500 mg total) by mouth 3 (three) times daily.   I personally performed the services described in this documentation, which was scribed in my presence. The recorded information has been reviewed and is accurate.    Merryl Hacker, MD 12/18/16 [phone]

## 2016-12-20 LAB — URINE CULTURE

## 2016-12-21 ENCOUNTER — Telehealth: Payer: Self-pay

## 2016-12-21 NOTE — Progress Notes (Signed)
ED Antimicrobial Stewardship Positive Culture Follow Up   Alexander Rosales is an 83 y.o. male who presented to [facility] on 12/18/2016 with a chief complaint of  Chief Complaint  Patient presents with  . Hematuria    Recent Results (from the past 720 hour(s))  Urine culture     Status: Abnormal   Collection Time: 12/18/16  1:46 AM  Result Value Ref Range Status   Specimen Description URINE, CLEAN CATCH  Final   Special Requests NONE  Final   Culture (A)  Final    >=100,000 COLONIES/mL ESCHERICHIA COLI 50,000 COLONIES/mL PROTEUS MIRABILIS    Report Status 12/20/2016 FINAL  Final   Organism ID, Bacteria ESCHERICHIA COLI (A)  Final   Organism ID, Bacteria PROTEUS MIRABILIS (A)  Final      Susceptibility   Escherichia coli - MIC*    AMPICILLIN >=32 RESISTANT Resistant     CEFAZOLIN >=64 RESISTANT Resistant     CEFTRIAXONE <=1 SENSITIVE Sensitive     CIPROFLOXACIN <=0.25 SENSITIVE Sensitive     GENTAMICIN <=1 SENSITIVE Sensitive     IMIPENEM <=0.25 SENSITIVE Sensitive     NITROFURANTOIN <=16 SENSITIVE Sensitive     TRIMETH/SULFA <=20 SENSITIVE Sensitive     AMPICILLIN/SULBACTAM >=32 RESISTANT Resistant     PIP/TAZO 8 SENSITIVE Sensitive     Extended ESBL NEGATIVE Sensitive     * >=100,000 COLONIES/mL ESCHERICHIA COLI   Proteus mirabilis - MIC*    AMPICILLIN <=2 SENSITIVE Sensitive     CEFAZOLIN <=4 SENSITIVE Sensitive     CEFTRIAXONE <=1 SENSITIVE Sensitive     CIPROFLOXACIN <=0.25 SENSITIVE Sensitive     GENTAMICIN <=1 SENSITIVE Sensitive     IMIPENEM 2 SENSITIVE Sensitive     NITROFURANTOIN 128 RESISTANT Resistant     TRIMETH/SULFA <=20 SENSITIVE Sensitive     AMPICILLIN/SULBACTAM <=2 SENSITIVE Sensitive     PIP/TAZO <=4 SENSITIVE Sensitive     * 50,000 COLONIES/mL PROTEUS MIRABILIS    [x]  Treated with keflex, organism resistant to prescribed antimicrobial []  Patient discharged originally without antimicrobial agent and treatment is now indicated  New antibiotic  prescription: DC keflex, start cefpodoxime 100mg  PO BID x 7 days  ED Provider: Providence Lanius, PA   [name], [name] 12/21/2016, 9:46 AM Clinical Pharmacist Phone# [phone]

## 2016-12-21 NOTE — Telephone Encounter (Signed)
Labs faxed to [facility] at [phone] with need for ABX 

## 2016-12-23 ENCOUNTER — Encounter: Payer: Self-pay | Admitting: Internal Medicine

## 2016-12-23 ENCOUNTER — Non-Acute Institutional Stay (SKILLED_NURSING_FACILITY): Payer: Medicare Other

## 2016-12-23 ENCOUNTER — Non-Acute Institutional Stay (SKILLED_NURSING_FACILITY): Payer: Medicare Other | Admitting: Internal Medicine

## 2016-12-23 DIAGNOSIS — N183 Chronic kidney disease, stage 3 unspecified: Secondary | ICD-10-CM

## 2016-12-23 DIAGNOSIS — Z0000 Encounter for general adult medical examination without abnormal findings: Secondary | ICD-10-CM

## 2016-12-23 DIAGNOSIS — I209 Angina pectoris, unspecified: Secondary | ICD-10-CM | POA: Diagnosis not present

## 2016-12-23 DIAGNOSIS — N138 Other obstructive and reflux uropathy: Secondary | ICD-10-CM | POA: Diagnosis not present

## 2016-12-23 DIAGNOSIS — R319 Hematuria, unspecified: Secondary | ICD-10-CM

## 2016-12-23 DIAGNOSIS — N401 Enlarged prostate with lower urinary tract symptoms: Secondary | ICD-10-CM

## 2016-12-23 DIAGNOSIS — Z85528 Personal history of other malignant neoplasm of kidney: Secondary | ICD-10-CM

## 2016-12-23 DIAGNOSIS — N390 Urinary tract infection, site not specified: Secondary | ICD-10-CM | POA: Diagnosis not present

## 2016-12-23 DIAGNOSIS — I10 Essential (primary) hypertension: Secondary | ICD-10-CM

## 2016-12-23 DIAGNOSIS — I25119 Atherosclerotic heart disease of native coronary artery with unspecified angina pectoris: Secondary | ICD-10-CM | POA: Diagnosis not present

## 2016-12-23 NOTE — Progress Notes (Signed)
Subjective:   Alexander Rosales is a 83 y.o. male who presents for an Initial Medicare Annual Wellness Visit  At [facility] long term SNF; incapacitated patient unable to answer questions appropriately.   Objective:    Today's Vitals   12/23/16 1500  BP: (!) 110/58  Pulse: 69  Temp: 97 F (36.1 C)  TempSrc: Oral  SpO2: 98%  Weight: 102 lb (46.3 kg)  Height: 5\' 1"  (1.549 m)   Body mass index is 19.27 kg/m.  Current Medications (verified) Outpatient Encounter Prescriptions as of 12/23/2016  Medication Sig  . amLODipine-benazepril (LOTREL) 10-20 MG per capsule Take 1 capsule by mouth daily.  . cephALEXin (KEFLEX) 500 MG capsule Take 1 capsule (500 mg total) by mouth 3 (three) times daily.  . ciprofloxacin (CIPRO) 500 MG/5ML (10%) suspension Take by mouth 2 (two) times daily.  . hydrALAZINE (APRESOLINE) 25 MG tablet Take 1 tablet (25 mg total) by mouth 2 (two) times daily.  Marland Kitchen loperamide (IMODIUM) 2 MG capsule Take 2 mg by mouth daily.  . metoprolol (LOPRESSOR) 100 MG tablet Take 100 mg by mouth daily.  . Nutritional Supplements (NUTRITIONAL SUPPLEMENT PO) Take by mouth. House 2.0 - give 237 ml two times daily  . Nutritional Supplements (NUTRITIONAL SUPPLEMENT PO) Nutritional Treat with meals for weight support  . ranitidine (ZANTAC) 150 MG tablet Take 150 mg by mouth at bedtime.  . Tamsulosin HCl (FLOMAX) 0.4 MG CAPS Take 1 capsule (0.4 mg total) by mouth daily.   No facility-administered encounter medications on file as of 12/23/2016.     Allergies (verified) Aspirin   History: Past Medical History:  Diagnosis Date  . Adult failure to thrive   . Anemia, unspecified   . Benign prostatic hyperplasia with lower urinary tract symptoms 10/01/2016  . Blind   . CAD (coronary artery disease)   . Chronic kidney disease   . Deaf   . GERD (gastroesophageal reflux disease)   . Hyperkalemia   . Hyperlipidemia   . Hypertension   . Malignant neoplasm of kidney ([place])   . Mute   .  Urinary tract infection, site not specified    Past Surgical History:  Procedure Laterality Date  . CHOLECYSTECTOMY    . left partial nephrectomy     History reviewed. No pertinent family history. Social History   Occupational History  . Not on file.   Social History Main Topics  . Smoking status: Never Smoker  . Smokeless tobacco: Never Used  . Alcohol use No  . Drug use: No  . Sexual activity: Not on file   Tobacco Counseling Counseling given: Not Answered   Activities of Daily Living In your present state of health, do you have any difficulty performing the following activities: 12/23/2016 09/27/2016  Hearing? Tempie Donning  Vision? Y Y  Difficulty concentrating or making decisions? Tempie Donning  Walking or climbing stairs? Y Y  Dressing or bathing? Y Y  Doing errands, shopping? Y -  Conservation officer, nature and eating ? Y -  Using the Toilet? Y -  In the past six months, have you accidently leaked urine? Y -  Do you have problems with loss of bowel control? Y -  Managing your Medications? Y -  Managing your Finances? Y -  Housekeeping or managing your Housekeeping? Y -  Some recent data might be hidden    Immunizations and Health Maintenance Immunization History  Administered Date(s) Administered  . Influenza Split 08/19/2011  . PPD Test 10/17/2016   There  are no preventive care reminders to display for this patient.  Patient Care Team: Gildardo Cranker, DO as PCP - General (Internal Medicine)  Indicate any recent Medical Services you may have received from other than [facility] providers in the past year (date may be approximate).    Assessment:   This is a routine wellness examination for Alexander Rosales.   Hearing/Vision screen No exam data present  Dietary issues and exercise activities discussed: Current Exercise Habits: The patient does not participate in regular exercise at present, Exercise limited by: orthopedic condition(s)  Goals    None     Depression Screen PHQ 2/9 Scores 12/23/2016  09/16/2011 08/19/2011  PHQ - 2 Score - 0 0  Exception Documentation Medical reason - -    Fall Risk Fall Risk  12/23/2016  Falls in the past year? No    Cognitive Function: MMSE - Mini Mental State Exam 12/23/2016  Not completed: Unable to complete        Screening Tests Health Maintenance  Topic Date Due  . TETANUS/TDAP  10/02/2017 (Originally 07/31/1948)  . PNA vac Low Risk Adult (1 of 2 - PCV13) 10/02/2017 (Originally 07/31/1996)  . INFLUENZA VACCINE  01/22/2017        Plan:    I have personally reviewed and addressed the Medicare Annual Wellness questionnaire and have noted the following in the patient's chart:  A. Medical and social history B. Use of alcohol, tobacco or illicit drugs  C. Current medications and supplements D. Functional ability and status E.  Nutritional status F.  Physical activity G. Advance directives H. List of other physicians I.  Hospitalizations, surgeries, and ER visits in previous 12 months J.  [place] to include hearing, vision, cognitive, depression L. Referrals and appointments - none  In addition, Unable to review quality metrics, and best practice recommendations with incapacitated patient. A written personalized care plan for preventive services as well as general preventive health recommendations were provided to patient.  See attached scanned questionnaire for additional information.   Signed,   Rich Reining, RN Nurse Health Advisor   Quick Notes   Health Maintenance: TDAP, PNA 23 due.     Abnormal Screen: Unable to complete mental exam     Patient Concerns: None     Nurse Concerns: None

## 2016-12-23 NOTE — Progress Notes (Signed)
Patient ID: Alexander Rosales, male   DOB: 1929/09/23, 83 y.o.   MRN: [id number]    DATE:  12/23/2016  Location:    [facility] Room Number: 102 A Place of Service: SNF (31)   Extended Emergency Contact Information Primary Emergency Contact: Pryor,[name] Address: [address]          [place], [place] [address] [place] Phone: [phone] Relation: Other Secondary Emergency Contact: [name] Address: [address]          [place], [place] [address] [place] Phone: [phone] Relation: Niece  Advanced Directive information Does Patient Have a Medical Advance Directive?: Yes, Would patient like information on creating a medical advance directive?: No - Patient declined, Type of Advance Directive: Out of facility DNR (pink MOST or yellow form), Pre-existing out of facility DNR order (yellow form or pink MOST form): Yellow form placed in chart (order not valid for inpatient use);Pink MOST form placed in chart (order not valid for inpatient use), Does patient want to make changes to medical advance directive?: No - Patient declined  Chief Complaint  Patient presents with  . Medical Management of Chronic Issues    Routine Visit    HPI:  83 yo male long term resident seen today for f/u. He has no concerns. No nursing issues. No falls. No f/c. He is being tx for E coli/Proteus mirablis UTI with Cipro as noted by ED 12/18/16. Followed by urology.   Hypertension - stable on lotrel 10/20 mg daily; apresoline 25 mg twice daily; lopressor 100 mg daily.   CAD - stable. No CP. He takes lopressor 100 mg daily   GERD - stable on zantac 150 mg daily   Chronic diarrhea - stable on imodium 2 mg daily   BPH with urinary retention - s/p foley cath. He takes flomax 0.4 mg daily. Penile edema improved  CKD - stage 3. Stable. Cr 1.0  Anemia of chronic disease - due to CKD. Hgb 11.9  Hx renal cell CA - s/p partial left nephrectomy. Followed by  urology  Severe protein calorie malnutrition - stable on nutritional supplements per facility protocol. Albumin 2.8  Past Medical History:  Diagnosis Date  . Adult failure to thrive   . Anemia, unspecified   . Benign prostatic hyperplasia with lower urinary tract symptoms 10/01/2016  . Blind   . CAD (coronary artery disease)   . Chronic kidney disease   . Deaf   . GERD (gastroesophageal reflux disease)   . Hyperkalemia   . Hyperlipidemia   . Hypertension   . Malignant neoplasm of kidney ([place])   . [place]   . Urinary tract infection, site not specified     Past Surgical History:  Procedure Laterality Date  . CHOLECYSTECTOMY    . left partial nephrectomy      Patient Care Team: Gildardo Cranker, DO as PCP - General (Internal Medicine)  Social History   Social History  . Marital status: Single    Spouse name: N/A  . Number of children: N/A  . Years of education: N/A   Occupational History  . Not on file.   Social History Main Topics  . Smoking status: Never Smoker  . Smokeless tobacco: Never Used  . Alcohol use No  . Drug use: No  . Sexual activity: Not on file   Other Topics Concern  . Not on file   Social History Narrative  . No narrative on file  reports that he has never smoked. He has never used smokeless tobacco. He reports that he does not drink alcohol or use drugs.  History reviewed. No pertinent family history. No family status information on file.    Immunization History  Administered Date(s) Administered  . Influenza Split 08/19/2011  . PPD Test 10/17/2016    Allergies  Allergen Reactions  . Aspirin Other (See Comments)    Reaction:  Unknown     Medications: Patient's Medications  New Prescriptions   No medications on file  Previous Medications   AMLODIPINE-BENAZEPRIL (LOTREL) 10-20 MG PER CAPSULE    Take 1 capsule by mouth daily.   CEPHALEXIN (KEFLEX) 500 MG CAPSULE    Take 1 capsule (500 mg total) by mouth 3 (three) times daily.    HYDRALAZINE (APRESOLINE) 25 MG TABLET    Take 1 tablet (25 mg total) by mouth 2 (two) times daily.   LOPERAMIDE (IMODIUM) 2 MG CAPSULE    Take 2 mg by mouth daily.   METOPROLOL (LOPRESSOR) 100 MG TABLET    Take 100 mg by mouth daily.   NUTRITIONAL SUPPLEMENTS (NUTRITIONAL SUPPLEMENT PO)    Take by mouth. House 2.0 - give 237 ml two times daily   NUTRITIONAL SUPPLEMENTS (NUTRITIONAL SUPPLEMENT PO)    Nutritional Treat with meals for weight support   RANITIDINE (ZANTAC) 150 MG TABLET    Take 150 mg by mouth at bedtime.   TAMSULOSIN HCL (FLOMAX) 0.4 MG CAPS    Take 1 capsule (0.4 mg total) by mouth daily.  Modified Medications   No medications on file  Discontinued Medications   No medications on file    Review of Systems  Unable to perform ROS: Other    Vitals:   12/23/16 1121  BP: 133/74  Pulse: 78  Resp: 16  Temp: 97.6 F (36.4 C)  TempSrc: Oral  SpO2: 99%  Weight: 103 lb 9.6 oz (47 kg)  Height: 5' 1"  (1.549 m)   Body mass index is 19.58 kg/m.  Physical Exam  Constitutional: He appears well-developed and well-nourished.  Frail appearing in NAD. Sitting up in bed, asleep  HENT:  Mouth/Throat: Oropharynx is clear and moist.  Deaf; MMM; no oral thrush  Eyes: No scleral icterus.  Blind OU  Neck: Neck supple. Carotid bruit is not present. No thyromegaly present.  Cardiovascular: Normal rate, regular rhythm and intact distal pulses.  Exam reveals no gallop and no friction rub.   Murmur (1/6 SEM) heard. No distal LE edema. No calf TTP  Pulmonary/Chest: Effort normal and breath sounds normal. He has no wheezes. He has no rales. He exhibits no tenderness.  Abdominal: Soft. Normal appearance and bowel sounds are normal. He exhibits no distension, no abdominal bruit, no pulsatile midline mass and no mass. There is no hepatomegaly. There is no tenderness. There is no rigidity, no rebound and no guarding. No hernia.  Genitourinary:  Genitourinary Comments: Foley DTG clear yellow  urine  Lymphadenopathy:    He has no cervical adenopathy.  Neurological: He is alert.  Skin: Skin is warm and dry. No rash noted.  Psychiatric: He has a normal mood and affect. His behavior is normal.     Labs reviewed: Admission on 12/18/2016, Discharged on 12/18/2016  Component Date Value Ref Range Status  . Color, Urine 12/18/2016 YELLOW  YELLOW Final  . APPearance 12/18/2016 CLOUDY* CLEAR Final  . Specific Gravity, Urine 12/18/2016 1.015  1.005 - 1.030 Final  . pH 12/18/2016 6.0  5.0 - 8.0 Final  .  Glucose, UA 12/18/2016 NEGATIVE  NEGATIVE mg/dL Final  . Hgb urine dipstick 12/18/2016 SMALL* NEGATIVE Final  . Bilirubin Urine 12/18/2016 NEGATIVE  NEGATIVE Final  . Ketones, ur 12/18/2016 NEGATIVE  NEGATIVE mg/dL Final  . Protein, ur 12/18/2016 NEGATIVE  NEGATIVE mg/dL Final  . Nitrite 12/18/2016 POSITIVE* NEGATIVE Final  . Leukocytes, UA 12/18/2016 MODERATE* NEGATIVE Final  . Sodium 12/18/2016 139  135 - 145 mmol/L Final  . Potassium 12/18/2016 3.6  3.5 - 5.1 mmol/L Final  . Chloride 12/18/2016 104  101 - 111 mmol/L Final  . BUN 12/18/2016 17  6 - 20 mg/dL Final  . Creatinine, Ser 12/18/2016 1.00  0.61 - 1.24 mg/dL Final  . Glucose, Bld 12/18/2016 92  65 - 99 mg/dL Final  . Calcium, Ion 12/18/2016 1.15  1.15 - 1.40 mmol/L Final  . TCO2 12/18/2016 22  0 - 100 mmol/L Final  . Hemoglobin 12/18/2016 11.9* 13.0 - 17.0 g/dL Final  . HCT 12/18/2016 35.0* 39.0 - 52.0 % Final  . RBC / HPF 12/18/2016 0-5  0 - 5 RBC/hpf Final  . WBC, UA 12/18/2016 6-30  0 - 5 WBC/hpf Final  . Bacteria, UA 12/18/2016 MANY* NONE SEEN Final  . Squamous Epithelial / LPF 12/18/2016 0-5* NONE SEEN Final  . Specimen Description 12/18/2016 URINE, CLEAN CATCH   Final  . Special Requests 12/18/2016 NONE   Final  . Culture 12/18/2016 *  Final                   Value:>=100,000 COLONIES/mL ESCHERICHIA COLI 50,000 COLONIES/mL PROTEUS MIRABILIS   . Report Status 12/18/2016 12/20/2016 FINAL   Final  . Organism ID,  Bacteria 12/18/2016 ESCHERICHIA COLI*  Final  . Organism ID, Bacteria 12/18/2016 PROTEUS MIRABILIS*  Final  Abstract on 10/18/2016  Component Date Value Ref Range Status  . Hemoglobin 10/16/2016 12.2* 13.5 - 17.5 g/dL Final  . HCT 10/16/2016 36* 41 - 53 % Final  . Neutrophils Absolute 10/16/2016 6  /L Final  . Platelets 10/16/2016 340  150 - 399 K/L Final  . WBC 10/16/2016 9.9  10^3/mL Final  . Glucose 10/16/2016 89  mg/dL Final  . BUN 10/16/2016 16  4 - 21 mg/dL Final  . Creatinine 10/16/2016 1.0  0.6 - 1.3 mg/dL Final  . Potassium 10/16/2016 3.8  3.4 - 5.3 mmol/L Final  . Sodium 10/16/2016 136* 137 - 147 mmol/L Final  Admission on 09/27/2016, Discharged on 10/01/2016  Component Date Value Ref Range Status  . WBC 09/27/2016 13.2* 4.0 - 10.5 K/uL Final  . RBC 09/27/2016 3.93* 4.22 - 5.81 MIL/uL Final  . Hemoglobin 09/27/2016 11.7* 13.0 - 17.0 g/dL Final  . HCT 09/27/2016 33.3* 39.0 - 52.0 % Final  . MCV 09/27/2016 84.7  78.0 - 100.0 fL Final  . MCH 09/27/2016 29.8  26.0 - 34.0 pg Final  . MCHC 09/27/2016 35.1  30.0 - 36.0 g/dL Final  . RDW 09/27/2016 12.8  11.5 - 15.5 % Final  . Platelets 09/27/2016 185  150 - 400 K/uL Final  . Sodium 09/27/2016 130* 135 - 145 mmol/L Final  . Potassium 09/27/2016 6.4* 3.5 - 5.1 mmol/L Final   Comment: CRITICAL RESULT CALLED TO, READ BACK BY AND VERIFIED WITH: TALKINGTON,J. RN @1620  ON 04.06.18 BY COHEN,K   . Chloride 09/27/2016 89* 101 - 111 mmol/L Final  . CO2 09/27/2016 16* 22 - 32 mmol/L Final  . Glucose, Bld 09/27/2016 116* 65 - 99 mg/dL Final  . BUN 09/27/2016 176* 6 -  20 mg/dL Final   RESULTS CONFIRMED BY MANUAL DILUTION  . Creatinine, Ser 09/27/2016 24.64* 0.61 - 1.24 mg/dL Final  . Calcium 09/27/2016 8.8* 8.9 - 10.3 mg/dL Final  . Total Protein 09/27/2016 6.9  6.5 - 8.1 g/dL Final  . Albumin 09/27/2016 3.5  3.5 - 5.0 g/dL Final  . AST 09/27/2016 25  15 - 41 U/L Final  . ALT 09/27/2016 23  17 - 63 U/L Final  . Alkaline Phosphatase  09/27/2016 55  38 - 126 U/L Final  . Total Bilirubin 09/27/2016 0.6  0.3 - 1.2 mg/dL Final  . GFR calc non Af Amer 09/27/2016 1* >60 mL/min Final  . GFR calc Af Amer 09/27/2016 2* >60 mL/min Final   Comment: (NOTE) The eGFR has been calculated using the CKD EPI equation. This calculation has not been validated in all clinical situations. eGFR's persistently <60 mL/min signify possible Chronic Kidney Disease.   . Anion gap 09/27/2016 25* 5 - 15 Final  . Color, Urine 09/27/2016 YELLOW  YELLOW Final  . APPearance 09/27/2016 HAZY* CLEAR Final  . Specific Gravity, Urine 09/27/2016 1.010  1.005 - 1.030 Final  . pH 09/27/2016 6.0  5.0 - 8.0 Final  . Glucose, UA 09/27/2016 NEGATIVE  NEGATIVE mg/dL Final  . Hgb urine dipstick 09/27/2016 LARGE* NEGATIVE Final  . Bilirubin Urine 09/27/2016 NEGATIVE  NEGATIVE Final  . Ketones, ur 09/27/2016 NEGATIVE  NEGATIVE mg/dL Final  . Protein, ur 09/27/2016 100* NEGATIVE mg/dL Final  . Nitrite 09/27/2016 NEGATIVE  NEGATIVE Final  . Leukocytes, UA 09/27/2016 LARGE* NEGATIVE Final  . RBC / HPF 09/27/2016 TOO NUMEROUS TO COUNT  0 - 5 RBC/hpf Final  . WBC, UA 09/27/2016 TOO NUMEROUS TO COUNT  0 - 5 WBC/hpf Final  . Bacteria, UA 09/27/2016 RARE* NONE SEEN Final  . Squamous Epithelial / LPF 09/27/2016 NONE SEEN  NONE SEEN Final  . Specimen Description 09/27/2016 URINE, RANDOM   Final  . Special Requests 09/27/2016 NONE   Final  . Culture 09/27/2016    Final                   Value:NO GROWTH Performed at [facility], [place] [address], [place], [place] [address]   . Report Status 09/27/2016 09/29/2016 FINAL   Final  . Sodium 09/28/2016 135  135 - 145 mmol/L Final  . Potassium 09/28/2016 3.5  3.5 - 5.1 mmol/L Final   DELTA CHECK NOTED  . Chloride 09/28/2016 99* 101 - 111 mmol/L Final  . CO2 09/28/2016 19* 22 - 32 mmol/L Final  . Glucose, Bld 09/28/2016 153* 65 - 99 mg/dL Final  . BUN 09/28/2016 116* 6 - 20 mg/dL Final   RESULTS CONFIRMED BY MANUAL  DILUTION  . Creatinine, Ser 09/28/2016 10.48* 0.61 - 1.24 mg/dL Final  . Calcium 09/28/2016 9.0  8.9 - 10.3 mg/dL Final  . Total Protein 09/28/2016 5.9* 6.5 - 8.1 g/dL Final  . Albumin 09/28/2016 2.8* 3.5 - 5.0 g/dL Final  . AST 09/28/2016 25  15 - 41 U/L Final  . ALT 09/28/2016 22  17 - 63 U/L Final  . Alkaline Phosphatase 09/28/2016 44  38 - 126 U/L Final  . Total Bilirubin 09/28/2016 1.1  0.3 - 1.2 mg/dL Final  . GFR calc non Af Amer 09/28/2016 4* >60 mL/min Final  . GFR calc Af Amer 09/28/2016 4* >60 mL/min Final   Comment: (NOTE) The eGFR has been calculated using the CKD EPI equation. This calculation has not been  validated in all clinical situations. eGFR's persistently <60 mL/min signify possible Chronic Kidney Disease.   . Anion gap 09/28/2016 17* 5 - 15 Final  . WBC 09/28/2016 9.8  4.0 - 10.5 K/uL Final  . RBC 09/28/2016 3.55* 4.22 - 5.81 MIL/uL Final  . Hemoglobin 09/28/2016 10.6* 13.0 - 17.0 g/dL Final  . HCT 09/28/2016 30.2* 39.0 - 52.0 % Final  . MCV 09/28/2016 85.1  78.0 - 100.0 fL Final  . MCH 09/28/2016 29.9  26.0 - 34.0 pg Final  . MCHC 09/28/2016 35.1  30.0 - 36.0 g/dL Final  . RDW 09/28/2016 12.8  11.5 - 15.5 % Final  . Platelets 09/28/2016 166  150 - 400 K/uL Final  . MRSA by PCR 09/27/2016 NEGATIVE  NEGATIVE Final   Comment:        The GeneXpert MRSA Assay (FDA approved for NASAL specimens only), is one component of a comprehensive MRSA colonization surveillance program. It is not intended to diagnose MRSA infection nor to guide or monitor treatment for MRSA infections.   . WBC 09/29/2016 12.1* 4.0 - 10.5 K/uL Final  . RBC 09/29/2016 3.58* 4.22 - 5.81 MIL/uL Final  . Hemoglobin 09/29/2016 10.7* 13.0 - 17.0 g/dL Final  . HCT 09/29/2016 31.3* 39.0 - 52.0 % Final  . MCV 09/29/2016 87.4  78.0 - 100.0 fL Final  . MCH 09/29/2016 29.9  26.0 - 34.0 pg Final  . MCHC 09/29/2016 34.2  30.0 - 36.0 g/dL Final  . RDW 09/29/2016 12.7  11.5 - 15.5 % Final  .  Platelets 09/29/2016 155  150 - 400 K/uL Final  . Neutrophils Relative % 09/29/2016 78  % Final  . Neutro Abs 09/29/2016 9.5* 1.7 - 7.7 K/uL Final  . Lymphocytes Relative 09/29/2016 10  % Final  . Lymphs Abs 09/29/2016 1.2  0.7 - 4.0 K/uL Final  . Monocytes Relative 09/29/2016 11  % Final  . Monocytes Absolute 09/29/2016 1.3* 0.1 - 1.0 K/uL Final  . Eosinophils Relative 09/29/2016 1  % Final  . Eosinophils Absolute 09/29/2016 0.1  0.0 - 0.7 K/uL Final  . Basophils Relative 09/29/2016 0  % Final  . Basophils Absolute 09/29/2016 0.0  0.0 - 0.1 K/uL Final  . Sodium 09/29/2016 139  135 - 145 mmol/L Final  . Potassium 09/29/2016 3.1* 3.5 - 5.1 mmol/L Final  . Chloride 09/29/2016 103  101 - 111 mmol/L Final  . CO2 09/29/2016 28  22 - 32 mmol/L Final  . Glucose, Bld 09/29/2016 107* 65 - 99 mg/dL Final  . BUN 09/29/2016 50* 6 - 20 mg/dL Final  . Creatinine, Ser 09/29/2016 2.44* 0.61 - 1.24 mg/dL Final   Comment: DELTA CHECK NOTED REPEATED TO VERIFY   . Calcium 09/29/2016 8.9  8.9 - 10.3 mg/dL Final  . Total Protein 09/29/2016 6.0* 6.5 - 8.1 g/dL Final  . Albumin 09/29/2016 2.9* 3.5 - 5.0 g/dL Final  . AST 09/29/2016 27  15 - 41 U/L Final  . ALT 09/29/2016 22  17 - 63 U/L Final  . Alkaline Phosphatase 09/29/2016 44  38 - 126 U/L Final  . Total Bilirubin 09/29/2016 1.0  0.3 - 1.2 mg/dL Final  . GFR calc non Af Amer 09/29/2016 23* >60 mL/min Final  . GFR calc Af Amer 09/29/2016 26* >60 mL/min Final   Comment: (NOTE) The eGFR has been calculated using the CKD EPI equation. This calculation has not been validated in all clinical situations. eGFR's persistently <60 mL/min signify possible Chronic Kidney Disease.   Marland Kitchen  Anion gap 09/29/2016 8  5 - 15 Final  . Sodium 09/30/2016 136  135 - 145 mmol/L Final  . Potassium 09/30/2016 3.7  3.5 - 5.1 mmol/L Final  . Chloride 09/30/2016 104  101 - 111 mmol/L Final  . CO2 09/30/2016 26  22 - 32 mmol/L Final  . Glucose, Bld 09/30/2016 97  65 - 99 mg/dL  Final  . BUN 09/30/2016 19  6 - 20 mg/dL Final  . Creatinine, Ser 09/30/2016 1.21  0.61 - 1.24 mg/dL Final   Comment: DELTA CHECK NOTED REPEATED TO VERIFY   . Calcium 09/30/2016 8.5* 8.9 - 10.3 mg/dL Final  . GFR calc non Af Amer 09/30/2016 53* >60 mL/min Final  . GFR calc Af Amer 09/30/2016 >60  >60 mL/min Final   Comment: (NOTE) The eGFR has been calculated using the CKD EPI equation. This calculation has not been validated in all clinical situations. eGFR's persistently <60 mL/min signify possible Chronic Kidney Disease.   . Anion gap 09/30/2016 6  5 - 15 Final  . WBC 09/30/2016 12.2* 4.0 - 10.5 K/uL Final  . RBC 09/30/2016 3.44* 4.22 - 5.81 MIL/uL Final  . Hemoglobin 09/30/2016 10.5* 13.0 - 17.0 g/dL Final  . HCT 09/30/2016 30.4* 39.0 - 52.0 % Final  . MCV 09/30/2016 88.4  78.0 - 100.0 fL Final  . MCH 09/30/2016 30.5  26.0 - 34.0 pg Final  . MCHC 09/30/2016 34.5  30.0 - 36.0 g/dL Final  . RDW 09/30/2016 12.7  11.5 - 15.5 % Final  . Platelets 09/30/2016 156  150 - 400 K/uL Final  . Sodium 09/30/2016 133* 135 - 145 mmol/L Final  . Potassium 09/30/2016 4.2  3.5 - 5.1 mmol/L Final  . Chloride 09/30/2016 102  101 - 111 mmol/L Final  . CO2 09/30/2016 23  22 - 32 mmol/L Final  . Glucose, Bld 09/30/2016 168* 65 - 99 mg/dL Final  . BUN 09/30/2016 17  6 - 20 mg/dL Final  . Creatinine, Ser 09/30/2016 1.24  0.61 - 1.24 mg/dL Final  . Calcium 09/30/2016 8.5* 8.9 - 10.3 mg/dL Final  . Total Protein 09/30/2016 6.1* 6.5 - 8.1 g/dL Final  . Albumin 09/30/2016 2.8* 3.5 - 5.0 g/dL Final  . AST 09/30/2016 33  15 - 41 U/L Final  . ALT 09/30/2016 21  17 - 63 U/L Final  . Alkaline Phosphatase 09/30/2016 45  38 - 126 U/L Final  . Total Bilirubin 09/30/2016 0.6  0.3 - 1.2 mg/dL Final  . GFR calc non Af Amer 09/30/2016 51* >60 mL/min Final  . GFR calc Af Amer 09/30/2016 59* >60 mL/min Final   Comment: (NOTE) The eGFR has been calculated using the CKD EPI equation. This calculation has not been  validated in all clinical situations. eGFR's persistently <60 mL/min signify possible Chronic Kidney Disease.   . Anion gap 09/30/2016 8  5 - 15 Final  . Magnesium 09/30/2016 1.3* 1.7 - 2.4 mg/dL Final  . Sodium 10/01/2016 131* 135 - 145 mmol/L Final  . Potassium 10/01/2016 3.8  3.5 - 5.1 mmol/L Final  . Chloride 10/01/2016 101  101 - 111 mmol/L Final  . CO2 10/01/2016 23  22 - 32 mmol/L Final  . Glucose, Bld 10/01/2016 99  65 - 99 mg/dL Final  . BUN 10/01/2016 14  6 - 20 mg/dL Final  . Creatinine, Ser 10/01/2016 1.17  0.61 - 1.24 mg/dL Final  . Calcium 10/01/2016 8.0* 8.9 - 10.3 mg/dL Final  . GFR calc non Af  Amer 10/01/2016 55* >60 mL/min Final  . GFR calc Af Amer 10/01/2016 >60  >60 mL/min Final   Comment: (NOTE) The eGFR has been calculated using the CKD EPI equation. This calculation has not been validated in all clinical situations. eGFR's persistently <60 mL/min signify possible Chronic Kidney Disease.   . Anion gap 10/01/2016 7  5 - 15 Final  . Magnesium 10/01/2016 1.8  1.7 - 2.4 mg/dL Final    No results found.   Assessment/Plan   ICD-10-CM   1. Urinary tract infection with hematuria, site unspecified N39.0    R31.9    resolving  2. BPH with urinary obstruction N40.1    N13.8    s/p foley cath  3. Essential hypertension, benign I10   4. CKD (chronic kidney disease), stage III N18.3   5. Personal history of renal cancer Z85.528   6. Atherosclerosis of native coronary artery of native heart with angina pectoris (HCC) I25.119    Check CMP  Cont nutritional supplements as ordered  Cont current meds as ordered. Finish cipro  PT/OT as indicated  F/u with urology as scheduled  Will follow   Pasco Marchitto S. Perlie Gold  [facility] [address] [place], [place] [address] [phone] Cell (Monday-Friday 8 AM - 5 PM) [phone] After 5 PM and follow prompts

## 2016-12-23 NOTE — Progress Notes (Signed)
This encounter was created in error - please disregard.

## 2016-12-23 NOTE — Patient Instructions (Signed)
Instructions given; to be scanned into chart

## 2016-12-24 DIAGNOSIS — Z79899 Other long term (current) drug therapy: Secondary | ICD-10-CM | POA: Diagnosis not present

## 2016-12-24 LAB — BASIC METABOLIC PANEL
BUN: 16 (ref 4–21)
CREATININE: 1.1 (ref 0.6–1.3)
GLUCOSE: 94
POTASSIUM: 4 (ref 3.4–5.3)
Sodium: 136 — AB (ref 137–147)

## 2016-12-24 LAB — HEPATIC FUNCTION PANEL
ALT: 8 — AB (ref 10–40)
AST: 12 — AB (ref 14–40)
Alkaline Phosphatase: 63 (ref 25–125)
Bilirubin, Total: 0.6

## 2016-12-25 DIAGNOSIS — Z23 Encounter for immunization: Secondary | ICD-10-CM | POA: Diagnosis not present

## 2017-01-13 ENCOUNTER — Non-Acute Institutional Stay (SKILLED_NURSING_FACILITY): Payer: Medicare Other | Admitting: Adult Health

## 2017-01-13 ENCOUNTER — Encounter: Payer: Self-pay | Admitting: Adult Health

## 2017-01-13 DIAGNOSIS — N4889 Other specified disorders of penis: Secondary | ICD-10-CM

## 2017-01-13 DIAGNOSIS — S3994XD Unspecified injury of external genitals, subsequent encounter: Secondary | ICD-10-CM | POA: Diagnosis not present

## 2017-01-13 NOTE — Progress Notes (Signed)
Location:   [place] Room Number: 102 A Place of Service:  SNF (31)   CODE STATUS: DNR  Allergies  Allergen Reactions  . Aspirin Other (See Comments)    Reaction:  Unknown     Chief Complaint  Patient presents with  . Acute Visit    Penile Issues    HPI:  Staff reports that they have found a penile tear present and he continues to have a foley in place. He is unable to participate in the hpi as he is deaf; mute and blind. He does have a leg strap in place for his foley. There are no reports of purulent drainage present; and no reports of fever present.    Past Medical History:  Diagnosis Date  . Adult failure to thrive   . Anemia, unspecified   . Benign prostatic hyperplasia with lower urinary tract symptoms 10/01/2016  . Blind   . CAD (coronary artery disease)   . Chronic kidney disease   . Deaf   . GERD (gastroesophageal reflux disease)   . Hyperkalemia   . Hyperlipidemia   . Hypertension   . Malignant neoplasm of kidney ([place])   . Mute   . Urinary tract infection, site not specified     Past Surgical History:  Procedure Laterality Date  . CHOLECYSTECTOMY    . left partial nephrectomy      Social History   Social History  . Marital status: Single    Spouse name: N/A  . Number of children: N/A  . Years of education: N/A   Occupational History  . Not on file.   Social History Main Topics  . Smoking status: Never Smoker  . Smokeless tobacco: Never Used  . Alcohol use No  . Drug use: No  . Sexual activity: Not on file   Other Topics Concern  . Not on file   Social History Narrative  . No narrative on file   History reviewed. No pertinent family history.    VITAL SIGNS BP 118/60   Pulse 75   Temp (!) 97.1 F (36.2 C)   Resp 16   Ht 5\' 6"  (1.676 m)   Wt 108 lb (49 kg)   SpO2 97%   BMI 17.43 kg/m   Patient's Medications  New Prescriptions   No medications on file  Previous Medications   AMLODIPINE-BENAZEPRIL  (LOTREL) 10-20 MG PER CAPSULE    Take 1 capsule by mouth daily.   HYDRALAZINE (APRESOLINE) 25 MG TABLET    Take 1 tablet (25 mg total) by mouth 2 (two) times daily.   LOPERAMIDE (IMODIUM) 2 MG CAPSULE    Take 2 mg by mouth daily.   METOPROLOL (LOPRESSOR) 100 MG TABLET    Take 100 mg by mouth daily.   NUTRITIONAL SUPPLEMENTS (NUTRITIONAL SUPPLEMENT PO)    Take by mouth. House 2.0 - [facility] -  Give  ml two times daily 120cc by mouth Three times daily   NUTRITIONAL SUPPLEMENTS (NUTRITIONAL SUPPLEMENT PO)    Nutritional Treat with meals for weight support   RANITIDINE (ZANTAC) 150 MG TABLET    Take 150 mg by mouth at bedtime.   TAMSULOSIN HCL (FLOMAX) 0.4 MG CAPS    Take 1 capsule (0.4 mg total) by mouth daily.  Modified Medications   No medications on file  Discontinued Medications   CEPHALEXIN (KEFLEX) 500 MG CAPSULE    Take 1 capsule (500 mg total) by mouth 3 (three) times daily.     SIGNIFICANT  DIAGNOSTIC EXAMS  PREVIOUS  09-28-16: renal ultrasound: 1. Bilateral hydronephrosis, at least moderate in degree bilaterally. Consider CT for further characterization. 2. Bladder decompressed by Foley catheter. Questionable bladder wall thickening.   LABS REVIEWED: PREVIOUS  09-27-16: wbc 13.2; hgb 11.7; hct 33.3; mcv 84.7; plt 185; glucose 116; bun 176; creat 24.64; k+ 6.4; na++ 130; liver normal albumin 3.5: urine culture: no growth 09-29-16; wbc 12.1; hgb 10.7; hct 31.3; mcv 87.4; plt 155; glucose 107; bun 50; creat 2.44; k+ 3.1; na++ 139; liver normal albumin 2.9 10-01-16: glucose 131; bun 14; creat 1.17; k+ 3.8; na++ 131; (creat clear 30.51) mag 1.8   TODAY:  12-24-16: glucose 94; bun 15.8; creat 1.05; k+ 4.0; na++ 136; ca 8.6; liver normal albumin 3.7     Review of Systems  Unable to perform ROS: Other (patient is deaf blind mute )    Physical Exam  Constitutional: No distress.  frail  Eyes: Conjunctivae are normal.  Neck: Neck supple. No JVD present. No thyromegaly present.    Cardiovascular: Normal rate and intact distal pulses.   Heart rate irregular  Respiratory: Effort normal and breath sounds normal. No respiratory distress. He has no wheezes.  GI: Soft. Bowel sounds are normal. He exhibits no distension. There is no tenderness.  Genitourinary:  Genitourinary Comments: Foley Has penile tear 2 cm long  No signs of infection present  Has less penile swelling present   Musculoskeletal: He exhibits no edema.  Able to move all extremities   Lymphadenopathy:    He has no cervical adenopathy.  Neurological:  Aware of his surroundings   Skin: Skin is warm and dry. He is not diaphoretic.     ASSESSMENT/ PLAN:  1. Penile edema:  2. Penile tear:  3. Urine retention Will use steri-strips to his tear to provide tissue support  Is due to see urology in the AM  Will monitor    MD is aware of resident's narcotic use and is in agreement with current plan of care. We will attempt to wean resident as apropriate    Ok Edwards NP [facility]  Contact [phone] Monday through Friday 8am- 5pm  After hours call [phone]

## 2017-01-14 DIAGNOSIS — R310 Gross hematuria: Secondary | ICD-10-CM | POA: Diagnosis not present

## 2017-01-23 ENCOUNTER — Non-Acute Institutional Stay (SKILLED_NURSING_FACILITY): Payer: Medicare Other | Admitting: Adult Health

## 2017-01-23 ENCOUNTER — Encounter: Payer: Self-pay | Admitting: Adult Health

## 2017-01-23 DIAGNOSIS — K219 Gastro-esophageal reflux disease without esophagitis: Secondary | ICD-10-CM

## 2017-01-23 DIAGNOSIS — I209 Angina pectoris, unspecified: Secondary | ICD-10-CM

## 2017-01-23 DIAGNOSIS — I129 Hypertensive chronic kidney disease with stage 1 through stage 4 chronic kidney disease, or unspecified chronic kidney disease: Secondary | ICD-10-CM

## 2017-01-23 DIAGNOSIS — N183 Chronic kidney disease, stage 3 unspecified: Secondary | ICD-10-CM

## 2017-01-23 DIAGNOSIS — N138 Other obstructive and reflux uropathy: Secondary | ICD-10-CM

## 2017-01-23 DIAGNOSIS — I25119 Atherosclerotic heart disease of native coronary artery with unspecified angina pectoris: Secondary | ICD-10-CM | POA: Diagnosis not present

## 2017-01-23 DIAGNOSIS — N401 Enlarged prostate with lower urinary tract symptoms: Secondary | ICD-10-CM | POA: Diagnosis not present

## 2017-01-23 NOTE — Progress Notes (Signed)
Location:   [place] Room Number: 102 A Place of Service:  SNF (31)   CODE STATUS: DNR  Allergies  Allergen Reactions  . Aspirin Other (See Comments)    Reaction:  Unknown     Chief Complaint  Patient presents with  . Medical Management of Chronic Issues    1 month follow up    HPI:  He is a 83 year old long term resident of this facility being seen for the management of his chronic illnesses: hypertension; cad; gerd; chronic diarrhea; bph; and chronic renal diseae. He is unable to participate in the hpi or ros as he is blind; deaf and mute. He does get out of bed daily. His status remains stable. There are no nursing concerns at this time. He has seen urology with a urine culture is pending.    Past Medical History:  Diagnosis Date  . Adult failure to thrive   . Anemia, unspecified   . Benign prostatic hyperplasia with lower urinary tract symptoms 10/01/2016  . Blind   . CAD (coronary artery disease)   . Chronic kidney disease   . Deaf   . GERD (gastroesophageal reflux disease)   . Hyperkalemia   . Hyperlipidemia   . Hypertension   . Malignant neoplasm of kidney ([place])   . Mute   . Urinary tract infection, site not specified     Past Surgical History:  Procedure Laterality Date  . CHOLECYSTECTOMY    . left partial nephrectomy      Social History   Social History  . Marital status: Single    Spouse name: N/A  . Number of children: N/A  . Years of education: N/A   Occupational History  . Not on file.   Social History Main Topics  . Smoking status: Never Smoker  . Smokeless tobacco: Never Used  . Alcohol use No  . Drug use: No  . Sexual activity: Not on file   Other Topics Concern  . Not on file   Social History Narrative  . No narrative on file   History reviewed. No pertinent family history.    VITAL SIGNS BP (!) 126/58   Pulse 79   Temp 98.4 F (36.9 C)   Resp 18   Ht 5\' 6"  (1.676 m)   Wt 107 lb 8 oz (48.8 kg)   SpO2  98%   BMI 17.35 kg/m   Patient's Medications  New Prescriptions   No medications on file  Previous Medications   AMLODIPINE-BENAZEPRIL (LOTREL) 10-20 MG PER CAPSULE    Take 1 capsule by mouth daily.   CRANBERRY 450 MG CAPS    Take 1 tablet by mouth daily.   HYDRALAZINE (APRESOLINE) 25 MG TABLET    Take 1 tablet (25 mg total) by mouth 2 (two) times daily.   LOPERAMIDE (IMODIUM) 2 MG CAPSULE    Take 2 mg by mouth daily.   METOPROLOL (LOPRESSOR) 100 MG TABLET    Take 100 mg by mouth daily.   NUTRITIONAL SUPPLEMENTS (NUTRITIONAL SUPPLEMENT PO)    Take by mouth. House 2.0 - [facility] -  Give  ml two times daily 120cc by mouth Three times daily   NUTRITIONAL SUPPLEMENTS (NUTRITIONAL SUPPLEMENT PO)    Nutritional Treat with meals for weight support   RANITIDINE (ZANTAC) 150 MG TABLET    Take 150 mg by mouth at bedtime.   TAMSULOSIN HCL (FLOMAX) 0.4 MG CAPS    Take 1 capsule (0.4 mg total) by mouth  daily.  Modified Medications   No medications on file  Discontinued Medications   No medications on file     SIGNIFICANT DIAGNOSTIC EXAMS  PREVIOUS  09-28-16: renal ultrasound: 1. Bilateral hydronephrosis, at least moderate in degree bilaterally. Consider CT for further characterization. 2. Bladder decompressed by Foley catheter. Questionable bladder wall thickening.  NO NEW EXAMS    LABS REVIEWED: PREVIOUS  09-27-16: wbc 13.2; hgb 11.7; hct 33.3; mcv 84.7; plt 185; glucose 116; bun 176; creat 24.64; k+ 6.4; na++ 130; liver normal albumin 3.5: urine culture: no growth 09-29-16; wbc 12.1; hgb 10.7; hct 31.3; mcv 87.4; plt 155; glucose 107; bun 50; creat 2.44; k+ 3.1; na++ 139; liver normal albumin 2.9 10-01-16: glucose 131; bun 14; creat 1.17; k+ 3.8; na++ 131; (creat clear 30.51) mag 1.8  12-24-16: glucose 94; bun 15.8; creat 1.05; k+ 4.0; na++ 136; ca 8.6; liver normal albumin 3.7   NO NEW LABS    Review of Systems  Unable to perform ROS: Other (is blind deaf and mute )   Physical Exam    Constitutional: No distress.  Eyes: Conjunctivae are normal.  Neck: Neck supple. No JVD present. No thyromegaly present.  Cardiovascular: Normal rate, regular rhythm and intact distal pulses.   Respiratory: Effort normal and breath sounds normal. No respiratory distress. He has no wheezes.  GI: Soft. Bowel sounds are normal. He exhibits no distension. There is no tenderness.  Genitourinary:  Genitourinary Comments: Has foley  Has penile teaer  Musculoskeletal: He exhibits no edema.  Able to move all extremities   Lymphadenopathy:    He has no cervical adenopathy.  Neurological:  Is aware of his surroundings   Skin: Skin is warm and dry. He is not diaphoretic.  Psychiatric: He has a normal mood and affect.    ASSESSMENT/ PLAN:  TODAY:   1. Hypertension: b/p 126/58 will continue lotrel 10/20 mg daily apresoline 25 mg twice daily; lopressor 100 mg daily.   2. CAD: is presently stable will continue lopressor 100 mg daily   3. gerd: will continue zantac 150 mg daily   4. Chronic diarrhea: is taking imodium 2 mg daily will monitor   5. BPH with acute urine retention: has foley in place will continue flomax 0.4 mg daily   6. Chronic renal disease stage III; has a history of malignant neoplasm of left kidney: is status post partial  left nephrectomy    Ok Edwards NP [facility]  Contact [phone] Monday through Friday 8am- 5pm  After hours call [phone]

## 2017-02-19 ENCOUNTER — Encounter: Payer: Self-pay | Admitting: Adult Health

## 2017-02-19 ENCOUNTER — Non-Acute Institutional Stay (SKILLED_NURSING_FACILITY): Payer: Medicare Other | Admitting: Adult Health

## 2017-02-19 DIAGNOSIS — R634 Abnormal weight loss: Secondary | ICD-10-CM | POA: Diagnosis not present

## 2017-02-19 NOTE — Progress Notes (Signed)
Location:   [place] Room Number: 102 A Place of Service:  SNF (31)   CODE STATUS: DNR  Allergies  Allergen Reactions  . Aspirin Other (See Comments)    Reaction:  Unknown     Chief Complaint  Patient presents with  . Acute Visit    weight loss    HPI:  I have been asked to see him for his weight loss. He is unable to participate in the hpi or ros. His weight on 11-26-16: 118 pounds; on 02-17-17: 99 pounds. Staff reports that he eats about 50 % of his meals; but will not eat at times.  He is taking supplements. He does get out of bed daily. He was due to go to urology today but has his appointment changed to Oct.   - Past Medical History:  Diagnosis Date  . Adult failure to thrive   . Anemia, unspecified   . Benign prostatic hyperplasia with lower urinary tract symptoms 10/01/2016  . Blind   . CAD (coronary artery disease)   . Chronic kidney disease   . Deaf   . GERD (gastroesophageal reflux disease)   . Hyperkalemia   . Hyperlipidemia   . Hypertension   . Malignant neoplasm of kidney ([place])   . Mute   . Urinary tract infection, site not specified     Past Surgical History:  Procedure Laterality Date  . CHOLECYSTECTOMY    . left partial nephrectomy      Social History   Social History  . Marital status: Single    Spouse name: N/A  . Number of children: N/A  . Years of education: N/A   Occupational History  . Not on file.   Social History Main Topics  . Smoking status: Never Smoker  . Smokeless tobacco: Never Used  . Alcohol use No  . Drug use: No  . Sexual activity: Not on file   Other Topics Concern  . Not on file   Social History Narrative  . No narrative on file   History reviewed. No pertinent family history.    VITAL SIGNS BP (!) 148/80   Pulse 74   Temp (!) 97.4 F (36.3 C)   Resp 18   Ht 5\' 6"  (1.676 m)   Wt 99 lb 14.4 oz (45.3 kg)   SpO2 97%   BMI 16.12 kg/m   Patient's Medications  New Prescriptions   No  medications on file  Previous Medications   AMLODIPINE-BENAZEPRIL (LOTREL) 10-20 MG PER CAPSULE    Take 1 capsule by mouth daily.   CRANBERRY 450 MG CAPS    Take 1 tablet by mouth daily.   HYDRALAZINE (APRESOLINE) 25 MG TABLET    Take 1 tablet (25 mg total) by mouth 2 (two) times daily.   LOPERAMIDE (IMODIUM) 2 MG CAPSULE    Take 2 mg by mouth daily.   METOPROLOL (LOPRESSOR) 100 MG TABLET    Take 100 mg by mouth daily.   NUTRITIONAL SUPPLEMENTS (NUTRITIONAL SUPPLEMENT PO)    Take by mouth. House 2.0 - [facility] -  Give  ml two times daily 120cc by mouth Three times daily   NUTRITIONAL SUPPLEMENTS (NUTRITIONAL SUPPLEMENT PO)    Nutritional Treat with meals for weight support   RANITIDINE (ZANTAC) 150 MG TABLET    Take 150 mg by mouth at bedtime.   TAMSULOSIN HCL (FLOMAX) 0.4 MG CAPS    Take 1 capsule (0.4 mg total) by mouth daily.  Modified Medications  No medications on file  Discontinued Medications   No medications on file     SIGNIFICANT DIAGNOSTIC EXAMS  PREVIOUS  09-28-16: renal ultrasound: 1. Bilateral hydronephrosis, at least moderate in degree bilaterally. Consider CT for further characterization. 2. Bladder decompressed by Foley catheter. Questionable bladder wall thickening.  NO NEW EXAMS    LABS REVIEWED: PREVIOUS  09-27-16: wbc 13.2; hgb 11.7; hct 33.3; mcv 84.7; plt 185; glucose 116; bun 176; creat 24.64; k+ 6.4; na++ 130; liver normal albumin 3.5: urine culture: no growth 09-29-16; wbc 12.1; hgb 10.7; hct 31.3; mcv 87.4; plt 155; glucose 107; bun 50; creat 2.44; k+ 3.1; na++ 139; liver normal albumin 2.9 10-01-16: glucose 131; bun 14; creat 1.17; k+ 3.8; na++ 131; (creat clear 30.51) mag 1.8  12-24-16: glucose 94; bun 15.8; creat 1.05; k+ 4.0; na++ 136; ca 8.6; liver normal albumin 3.7   NO NEW LABS   Review of Systems  Unable to perform ROS: Other (is blind deaf and mute)    Physical Exam  Constitutional: No distress.  Eyes: Conjunctivae are normal.  Neck: Neck  supple. No JVD present. No thyromegaly present.  Cardiovascular: Normal rate, regular rhythm and intact distal pulses.   Respiratory: Effort normal and breath sounds normal. No respiratory distress. He has no wheezes.  GI: Soft. Bowel sounds are normal. He exhibits no distension. There is no tenderness.  Genitourinary:  Genitourinary Comments: Has foley  Has penile teaer  Musculoskeletal: He exhibits no edema.  Able to move all extremities   Lymphadenopathy:    He has no cervical adenopathy.  Neurological:  Is aware of his surroundings   Skin: Skin is warm and dry. He is not diaphoretic.  Psychiatric: He has a normal mood and affect.    ASSESSMENT/ PLAN:  TODAY:   1.  Weight loss: is worse; will continue supplements as directed; will begin remeron 7.5 mg nighty for 30 days.   Will check cbc; cmp; tsh; vit b12 and folate     MD is aware of resident's narcotic use and is in agreement with current plan of care. We will attempt to wean resident as apropriate     Ok Edwards NP [facility]  Contact [phone] Monday through Friday 8am- 5pm  After hours call [phone]

## 2017-02-20 DIAGNOSIS — D519 Vitamin B12 deficiency anemia, unspecified: Secondary | ICD-10-CM | POA: Diagnosis not present

## 2017-02-20 DIAGNOSIS — E039 Hypothyroidism, unspecified: Secondary | ICD-10-CM | POA: Diagnosis not present

## 2017-02-20 DIAGNOSIS — D649 Anemia, unspecified: Secondary | ICD-10-CM | POA: Diagnosis not present

## 2017-02-20 DIAGNOSIS — R634 Abnormal weight loss: Secondary | ICD-10-CM | POA: Diagnosis not present

## 2017-02-20 LAB — HEPATIC FUNCTION PANEL
ALT: 9 — AB (ref 10–40)
AST: 12 — AB (ref 14–40)
Alkaline Phosphatase: 69 (ref 25–125)
Bilirubin, Total: 1.1

## 2017-02-20 LAB — BASIC METABOLIC PANEL
BUN: 11 (ref 4–21)
CREATININE: 0.8 (ref 0.6–1.3)
Glucose: 105
POTASSIUM: 4.1 (ref 3.4–5.3)
Sodium: 140 (ref 137–147)

## 2017-02-20 LAB — CBC AND DIFFERENTIAL
HCT: 42 (ref 41–53)
HEMOGLOBIN: 13.7 (ref 13.5–17.5)
Neutrophils Absolute: 5
Platelets: 253 (ref 150–399)
WBC: 8.2

## 2017-02-20 LAB — TSH: TSH: 2.05 (ref 0.41–5.90)

## 2017-02-24 DIAGNOSIS — R627 Adult failure to thrive: Secondary | ICD-10-CM | POA: Diagnosis not present

## 2017-02-25 ENCOUNTER — Non-Acute Institutional Stay (SKILLED_NURSING_FACILITY): Payer: Medicare Other | Admitting: Adult Health

## 2017-02-25 ENCOUNTER — Encounter: Payer: Self-pay | Admitting: Adult Health

## 2017-02-25 DIAGNOSIS — R627 Adult failure to thrive: Secondary | ICD-10-CM | POA: Diagnosis not present

## 2017-02-25 DIAGNOSIS — I10 Essential (primary) hypertension: Secondary | ICD-10-CM

## 2017-02-25 DIAGNOSIS — I25119 Atherosclerotic heart disease of native coronary artery with unspecified angina pectoris: Secondary | ICD-10-CM | POA: Diagnosis not present

## 2017-02-25 DIAGNOSIS — N401 Enlarged prostate with lower urinary tract symptoms: Secondary | ICD-10-CM | POA: Diagnosis not present

## 2017-02-25 DIAGNOSIS — I209 Angina pectoris, unspecified: Secondary | ICD-10-CM | POA: Diagnosis not present

## 2017-02-25 DIAGNOSIS — C642 Malignant neoplasm of left kidney, except renal pelvis: Secondary | ICD-10-CM | POA: Diagnosis not present

## 2017-02-25 DIAGNOSIS — N138 Other obstructive and reflux uropathy: Secondary | ICD-10-CM

## 2017-02-25 DIAGNOSIS — I129 Hypertensive chronic kidney disease with stage 1 through stage 4 chronic kidney disease, or unspecified chronic kidney disease: Secondary | ICD-10-CM | POA: Diagnosis not present

## 2017-02-25 DIAGNOSIS — N183 Chronic kidney disease, stage 3 (moderate): Secondary | ICD-10-CM

## 2017-02-25 DIAGNOSIS — K219 Gastro-esophageal reflux disease without esophagitis: Secondary | ICD-10-CM | POA: Diagnosis not present

## 2017-02-25 NOTE — Progress Notes (Signed)
Location:   [place] Room Number: 102 A Place of Service:  SNF (31)   CODE STATUS: DNR  Allergies  Allergen Reactions  . Aspirin Other (See Comments)    Reaction:  Unknown     Chief Complaint  Patient presents with  . Medical Management of Chronic Issues    hypertension; cad; gerd;chronic diarrhea; bph; crd stage III     HPI:  He is a 83 year old long term resident of this facility is being seen for the management of his chronic illnesses: hypertension; cad; gerd; chronic diarrhea; bph; ckd stage III. He is unable to participate in the hpi or ros. He is participating in therapy. There are reports of diarrhea; no reports of elevated blood pressure; no reports of pain. There are no nursing concerns at this time   Past Medical History:  Diagnosis Date  . Adult failure to thrive   . Anemia, unspecified   . Benign prostatic hyperplasia with lower urinary tract symptoms 10/01/2016  . Blind   . CAD (coronary artery disease)   . Chronic kidney disease   . Deaf   . GERD (gastroesophageal reflux disease)   . Hyperkalemia   . Hyperlipidemia   . Hypertension   . Malignant neoplasm of kidney ([place])   . Mute   . Urinary tract infection, site not specified     Past Surgical History:  Procedure Laterality Date  . CHOLECYSTECTOMY    . left partial nephrectomy      Social History   Social History  . Marital status: Single    Spouse name: N/A  . Number of children: N/A  . Years of education: N/A   Occupational History  . Not on file.   Social History Main Topics  . Smoking status: Never Smoker  . Smokeless tobacco: Never Used  . Alcohol use No  . Drug use: No  . Sexual activity: Not on file   Other Topics Concern  . Not on file   Social History Narrative  . No narrative on file   History reviewed. No pertinent family history.    VITAL SIGNS BP 122/68   Pulse (!) 103   Temp 98.9 F (37.2 C)   Resp 18   Ht 5\' 6"  (1.676 m)   Wt 101 lb 9.6 oz  (46.1 kg)   SpO2 98%   BMI 16.40 kg/m   Patient's Medications  New Prescriptions   No medications on file  Previous Medications   AMLODIPINE-BENAZEPRIL (LOTREL) 10-20 MG PER CAPSULE    Take 1 capsule by mouth daily.   CRANBERRY 450 MG CAPS    Take 1 tablet by mouth daily.   HYDRALAZINE (APRESOLINE) 25 MG TABLET    Take 1 tablet (25 mg total) by mouth 2 (two) times daily.   LOPERAMIDE (IMODIUM) 2 MG CAPSULE    Take 2 mg by mouth daily.   METOPROLOL (LOPRESSOR) 100 MG TABLET    Take 100 mg by mouth daily.   MIRTAZAPINE (REMERON) 7.5 MG TABLET    Take 7.5 mg by mouth at bedtime.   NUTRITIONAL SUPPLEMENTS (NUTRITIONAL SUPPLEMENT PO)    Take by mouth. House 2.0 - [facility] -  Give  ml two times daily 120cc by mouth Three times daily   RANITIDINE (ZANTAC) 150 MG TABLET    Take 150 mg by mouth at bedtime.   TAMSULOSIN HCL (FLOMAX) 0.4 MG CAPS    Take 1 capsule (0.4 mg total) by mouth daily.  Modified Medications  No medications on file  Discontinued Medications   NUTRITIONAL SUPPLEMENTS (NUTRITIONAL SUPPLEMENT PO)    Nutritional Treat with meals for weight support     SIGNIFICANT DIAGNOSTIC EXAMS  PREVIOUS  09-28-16: renal ultrasound: 1. Bilateral hydronephrosis, at least moderate in degree bilaterally. Consider CT for further characterization. 2. Bladder decompressed by Foley catheter. Questionable bladder wall thickening.  NO NEW EXAMS    LABS REVIEWED: PREVIOUS  09-27-16: wbc 13.2; hgb 11.7; hct 33.3; mcv 84.7; plt 185; glucose 116; bun 176; creat 24.64; k+ 6.4; na++ 130; liver normal albumin 3.5: urine culture: no growth 09-29-16; wbc 12.1; hgb 10.7; hct 31.3; mcv 87.4; plt 155; glucose 107; bun 50; creat 2.44; k+ 3.1; na++ 139; liver normal albumin 2.9 10-01-16: glucose 131; bun 14; creat 1.17; k+ 3.8; na++ 131; (creat clear 30.51) mag 1.8  12-24-16: glucose 94; bun 15.8; creat 1.05; k+ 4.0; na++ 136; ca 8.6; liver normal albumin 3.7   NO NEW LABS   Review of Systems  Unable to  perform ROS: Other (is blind deaf mute )   Physical Exam  Constitutional: No distress.  Eyes: Conjunctivae are normal.  Neck: Neck supple. No JVD present. No thyromegaly present.  Cardiovascular: Normal rate, regular rhythm and intact distal pulses.   Respiratory: Effort normal and breath sounds normal. No respiratory distress. He has no wheezes.  GI: Soft. Bowel sounds are normal. He exhibits no distension. There is no tenderness.  Genitourinary:  Genitourinary Comments: Has foley  Has small penile tear   Musculoskeletal: He exhibits no edema.  Able to move all extremities   Lymphadenopathy:    He has no cervical adenopathy.  Neurological:  Is aware of his surroundings   Skin: Skin is warm and dry. He is not diaphoretic.  Psychiatric: He has a normal mood and affect.   ASSESSMENT/ PLAN:  TODAY:   1. Hypertension: b/p 122/68 is stable  will continue lotrel 10/20 mg daily apresoline 25 mg twice daily; lopressor 100 mg daily.   2. CAD: is presently stable will continue lopressor 100 mg daily   3. gerd: will continue zantac 150 mg daily   4. Chronic diarrhea: stable  is taking imodium 2 mg daily will monitor   5. BPH with acute urine retention: has foley in place is stable  will continue flomax 0.4 mg daily   6. Chronic renal disease stage III; has a history of malignant neoplasm of left kidney: is status post partial  left nephrectomy: is stable       MD is aware of resident's narcotic use and is in agreement with current plan of care. We will attempt to wean resident as apropriate     Ok Edwards NP [facility] Medicine  Contact [phone] Monday through Friday 8am- 5pm  After hours call [phone]

## 2017-02-26 DIAGNOSIS — R627 Adult failure to thrive: Secondary | ICD-10-CM | POA: Diagnosis not present

## 2017-02-27 DIAGNOSIS — R627 Adult failure to thrive: Secondary | ICD-10-CM | POA: Diagnosis not present

## 2017-02-28 DIAGNOSIS — R627 Adult failure to thrive: Secondary | ICD-10-CM | POA: Diagnosis not present

## 2017-03-03 DIAGNOSIS — R627 Adult failure to thrive: Secondary | ICD-10-CM | POA: Diagnosis not present

## 2017-03-04 DIAGNOSIS — R627 Adult failure to thrive: Secondary | ICD-10-CM | POA: Diagnosis not present

## 2017-03-05 DIAGNOSIS — R627 Adult failure to thrive: Secondary | ICD-10-CM | POA: Diagnosis not present

## 2017-03-06 DIAGNOSIS — R627 Adult failure to thrive: Secondary | ICD-10-CM | POA: Diagnosis not present

## 2017-03-07 ENCOUNTER — Encounter: Payer: Self-pay | Admitting: Adult Health

## 2017-03-07 DIAGNOSIS — R627 Adult failure to thrive: Secondary | ICD-10-CM | POA: Diagnosis not present

## 2017-03-10 DIAGNOSIS — R627 Adult failure to thrive: Secondary | ICD-10-CM | POA: Diagnosis not present

## 2017-03-11 DIAGNOSIS — R627 Adult failure to thrive: Secondary | ICD-10-CM | POA: Diagnosis not present

## 2017-03-12 DIAGNOSIS — R627 Adult failure to thrive: Secondary | ICD-10-CM | POA: Diagnosis not present

## 2017-03-13 DIAGNOSIS — R627 Adult failure to thrive: Secondary | ICD-10-CM | POA: Diagnosis not present

## 2017-03-14 DIAGNOSIS — R627 Adult failure to thrive: Secondary | ICD-10-CM | POA: Diagnosis not present

## 2017-03-17 DIAGNOSIS — R627 Adult failure to thrive: Secondary | ICD-10-CM | POA: Diagnosis not present

## 2017-03-18 DIAGNOSIS — R627 Adult failure to thrive: Secondary | ICD-10-CM | POA: Diagnosis not present

## 2017-03-19 DIAGNOSIS — R627 Adult failure to thrive: Secondary | ICD-10-CM | POA: Diagnosis not present

## 2017-03-20 DIAGNOSIS — R627 Adult failure to thrive: Secondary | ICD-10-CM | POA: Diagnosis not present

## 2017-03-21 DIAGNOSIS — R627 Adult failure to thrive: Secondary | ICD-10-CM | POA: Diagnosis not present

## 2017-03-24 DIAGNOSIS — R627 Adult failure to thrive: Secondary | ICD-10-CM | POA: Diagnosis not present

## 2017-03-25 ENCOUNTER — Encounter: Payer: Self-pay | Admitting: Adult Health

## 2017-03-25 ENCOUNTER — Non-Acute Institutional Stay (SKILLED_NURSING_FACILITY): Payer: Medicare Other | Admitting: Adult Health

## 2017-03-25 DIAGNOSIS — N138 Other obstructive and reflux uropathy: Secondary | ICD-10-CM

## 2017-03-25 DIAGNOSIS — I25119 Atherosclerotic heart disease of native coronary artery with unspecified angina pectoris: Secondary | ICD-10-CM

## 2017-03-25 DIAGNOSIS — N401 Enlarged prostate with lower urinary tract symptoms: Secondary | ICD-10-CM | POA: Diagnosis not present

## 2017-03-25 DIAGNOSIS — I10 Essential (primary) hypertension: Secondary | ICD-10-CM

## 2017-03-25 DIAGNOSIS — N183 Chronic kidney disease, stage 3 unspecified: Secondary | ICD-10-CM

## 2017-03-25 DIAGNOSIS — I209 Angina pectoris, unspecified: Secondary | ICD-10-CM

## 2017-03-25 DIAGNOSIS — R627 Adult failure to thrive: Secondary | ICD-10-CM | POA: Diagnosis not present

## 2017-03-25 DIAGNOSIS — I129 Hypertensive chronic kidney disease with stage 1 through stage 4 chronic kidney disease, or unspecified chronic kidney disease: Secondary | ICD-10-CM

## 2017-03-25 NOTE — Progress Notes (Signed)
Location:   [facility] Room Number: 102 A Place of Service:  SNF (31)   CODE STATUS: DNR  Allergies  Allergen Reactions  . Aspirin Other (See Comments)    Reaction:  Unknown     Chief Complaint  Patient presents with  . Medical Management of Chronic Issues    Hypertension; cad; gerd; bph; chronic renal disease stage III; chronic diarrhea    HPI:  He is a 83 year old long term resident of this facility being seen for the management of his chronic illnesses: hypertension; cad; gerd; bph; crd stage III. Overall his status is stable.  His weight in Aug 2018: 107 pounds had a low of 99 pounds his current weight is 107 pounds; he has been treated with remeron and did have a weight gain. There are no reports of pain; no change in appetite; no behavioral changes. There are no nursing concerns at this time.   Past Medical History:  Diagnosis Date  . Adult failure to thrive   . Anemia, unspecified   . Benign prostatic hyperplasia with lower urinary tract symptoms 10/01/2016  . Blind   . CAD (coronary artery disease)   . Chronic kidney disease   . Coronary atherosclerosis 04/10/2006   Qualifier: Diagnosis of  By: Hilma Favors  DO, Beth    . Deaf   . GERD (gastroesophageal reflux disease)   . Hyperkalemia   . Hyperlipidemia   . Hypertension   . Malignant neoplasm of kidney ([place])   . Mute   . Urinary tract infection, site not specified     Past Surgical History:  Procedure Laterality Date  . CHOLECYSTECTOMY    . left partial nephrectomy      Social History   Social History  . Marital status: Single    Spouse name: N/A  . Number of children: N/A  . Years of education: N/A   Occupational History  . Not on file.   Social History Main Topics  . Smoking status: Never Smoker  . Smokeless tobacco: Never Used  . Alcohol use No  . Drug use: No  . Sexual activity: Not on file   Other Topics Concern  . Not on file   Social History Narrative  . No narrative on  file   History reviewed. No pertinent family history.    VITAL SIGNS BP (!) 148/78   Pulse 91   Temp (!) 97.3 F (36.3 C)   Resp 20   Ht 5\' 6"  (1.676 m)   Wt 102 lb (46.3 kg)   SpO2 97%   BMI 16.46 kg/m   Patient's Medications  New Prescriptions   No medications on file  Previous Medications   ACETAMINOPHEN (TYLENOL) 325 MG TABLET    Take 650 mg by mouth every 4 (four) hours as needed for mild pain.   AMLODIPINE-BENAZEPRIL (LOTREL) 10-20 MG PER CAPSULE    Take 1 capsule by mouth daily.   CRANBERRY 450 MG CAPS    Take 1 tablet by mouth daily.   HYDRALAZINE (APRESOLINE) 25 MG TABLET    Take 1 tablet (25 mg total) by mouth 2 (two) times daily.   LOPERAMIDE (IMODIUM) 2 MG CAPSULE    Take 2 mg by mouth daily.   METOPROLOL (LOPRESSOR) 100 MG TABLET    Take 100 mg by mouth daily.   NUTRITIONAL SUPPLEMENTS (NUTRITIONAL SUPPLEMENT PO)    Take by mouth. House 2.0 - Med Pass -  Give  ml two times daily 120cc by mouth  Three times daily   RANITIDINE (ZANTAC) 150 MG TABLET    Take 150 mg by mouth at bedtime.   TAMSULOSIN HCL (FLOMAX) 0.4 MG CAPS    Take 1 capsule (0.4 mg total) by mouth daily.  Modified Medications   No medications on file  Discontinued Medications   MIRTAZAPINE (REMERON) 7.5 MG TABLET    Take 7.5 mg by mouth at bedtime.     SIGNIFICANT DIAGNOSTIC EXAMS  PREVIOUS  09-28-16: renal ultrasound: 1. Bilateral hydronephrosis, at least moderate in degree bilaterally. Consider CT for further characterization. 2. Bladder decompressed by Foley catheter. Questionable bladder wall thickening.  NO NEW EXAMS    LABS REVIEWED: PREVIOUS  09-27-16: wbc 13.2; hgb 11.7; hct 33.3; mcv 84.7; plt 185; glucose 116; bun 176; creat 24.64; k+ 6.4; na++ 130; liver normal albumin 3.5: urine culture: no growth 09-29-16; wbc 12.1; hgb 10.7; hct 31.3; mcv 87.4; plt 155; glucose 107; bun 50; creat 2.44; k+ 3.1; na++ 139; liver normal albumin 2.9 10-01-16: glucose 131; bun 14; creat 1.17; k+ 3.8; na++  131; (creat clear 30.51) mag 1.8  12-24-16: glucose 94; bun 15.8; creat 1.05; k+ 4.0; na++ 136; ca 8.6; liver normal albumin 3.7   NO NEW LABS   Review of Systems  Unable to perform ROS: Other (blind deaf mute)   Physical Exam  Constitutional: No distress.  Neck: Neck supple. No thyromegaly present.  Cardiovascular: Normal rate, regular rhythm, normal heart sounds and intact distal pulses.   Pulmonary/Chest: Effort normal and breath sounds normal. No respiratory distress.  Abdominal: Soft. Bowel sounds are normal. He exhibits no distension. There is no tenderness.  Genitourinary:  Genitourinary Comments: Has foley and has penile tear   Musculoskeletal: Normal range of motion. He exhibits no edema.  Lymphadenopathy:    He has no cervical adenopathy.  Neurological:  Is aware  Skin: Skin is warm and dry. He is not diaphoretic.    ASSESSMENT/ PLAN:  TODAY:   1. Hypertension: b/p 148/78 is stable  will continue lotrel 10/20 mg daily apresoline 25 mg twice daily; lopressor 100 mg daily.   2. CAD: is presently stable will continue lopressor 100 mg daily   3. gerd: will continue zantac 150 mg daily   4. Chronic diarrhea: stable  is taking imodium 2 mg daily will monitor   5. BPH with urine retention: has foley in place is stable  will continue flomax 0.4 mg daily   6. Chronic renal disease stage III; has a history of malignant neoplasm of left kidney: is status post partial  left nephrectomy: bun 15.8; creat 1.05  is stable  7. Failure to thrive: weight is 102 pounds; has completed remeron therapy. Has improved from 99 pounds; will monitor  Is blind deaf and mute    MD is aware of resident's narcotic use and is in agreement with current plan of care. We will attempt to wean resident as apropriate     Ok Edwards NP [facility]  Contact [phone] Monday through Friday 8am- 5pm  After hours call [phone]

## 2017-03-26 DIAGNOSIS — R627 Adult failure to thrive: Secondary | ICD-10-CM | POA: Diagnosis not present

## 2017-03-27 DIAGNOSIS — R627 Adult failure to thrive: Secondary | ICD-10-CM | POA: Diagnosis not present

## 2017-03-28 DIAGNOSIS — R627 Adult failure to thrive: Secondary | ICD-10-CM | POA: Diagnosis not present

## 2017-03-29 DIAGNOSIS — R627 Adult failure to thrive: Secondary | ICD-10-CM | POA: Diagnosis not present

## 2017-03-31 DIAGNOSIS — R627 Adult failure to thrive: Secondary | ICD-10-CM | POA: Diagnosis not present

## 2017-04-01 DIAGNOSIS — R627 Adult failure to thrive: Secondary | ICD-10-CM | POA: Diagnosis not present

## 2017-04-02 ENCOUNTER — Emergency Department (HOSPITAL_COMMUNITY): Payer: Medicare Other

## 2017-04-02 ENCOUNTER — Emergency Department (HOSPITAL_COMMUNITY)
Admission: EM | Admit: 2017-04-02 | Discharge: 2017-04-02 | Disposition: A | Discharge status: Skilled Nursing Facility | Payer: Medicare Other | Attending: Emergency Medicine | Admitting: Emergency Medicine

## 2017-04-02 ENCOUNTER — Encounter (HOSPITAL_COMMUNITY): Payer: Self-pay

## 2017-04-02 DIAGNOSIS — R339 Retention of urine, unspecified: Secondary | ICD-10-CM | POA: Diagnosis not present

## 2017-04-02 DIAGNOSIS — R627 Adult failure to thrive: Secondary | ICD-10-CM | POA: Diagnosis not present

## 2017-04-02 DIAGNOSIS — R100 Acute abdomen: Secondary | ICD-10-CM | POA: Diagnosis not present

## 2017-04-02 DIAGNOSIS — I259 Chronic ischemic heart disease, unspecified: Secondary | ICD-10-CM | POA: Insufficient documentation

## 2017-04-02 DIAGNOSIS — B999 Unspecified infectious disease: Secondary | ICD-10-CM | POA: Diagnosis not present

## 2017-04-02 DIAGNOSIS — N183 Chronic kidney disease, stage 3 (moderate): Secondary | ICD-10-CM | POA: Insufficient documentation

## 2017-04-02 DIAGNOSIS — Z79899 Other long term (current) drug therapy: Secondary | ICD-10-CM | POA: Insufficient documentation

## 2017-04-02 DIAGNOSIS — Z466 Encounter for fitting and adjustment of urinary device: Secondary | ICD-10-CM | POA: Diagnosis not present

## 2017-04-02 DIAGNOSIS — I129 Hypertensive chronic kidney disease with stage 1 through stage 4 chronic kidney disease, or unspecified chronic kidney disease: Secondary | ICD-10-CM | POA: Insufficient documentation

## 2017-04-02 DIAGNOSIS — N390 Urinary tract infection, site not specified: Secondary | ICD-10-CM | POA: Diagnosis not present

## 2017-04-02 DIAGNOSIS — D649 Anemia, unspecified: Secondary | ICD-10-CM | POA: Diagnosis not present

## 2017-04-02 DIAGNOSIS — R9431 Abnormal electrocardiogram [ECG] [EKG]: Secondary | ICD-10-CM | POA: Diagnosis not present

## 2017-04-02 DIAGNOSIS — C649 Malignant neoplasm of unspecified kidney, except renal pelvis: Secondary | ICD-10-CM | POA: Insufficient documentation

## 2017-04-02 DIAGNOSIS — R338 Other retention of urine: Secondary | ICD-10-CM | POA: Diagnosis not present

## 2017-04-02 DIAGNOSIS — N401 Enlarged prostate with lower urinary tract symptoms: Secondary | ICD-10-CM | POA: Diagnosis not present

## 2017-04-02 DIAGNOSIS — T83091A Other mechanical complication of indwelling urethral catheter, initial encounter: Secondary | ICD-10-CM | POA: Diagnosis not present

## 2017-04-02 DIAGNOSIS — Z452 Encounter for adjustment and management of vascular access device: Secondary | ICD-10-CM | POA: Diagnosis not present

## 2017-04-02 LAB — URINALYSIS, ROUTINE W REFLEX MICROSCOPIC
BILIRUBIN URINE: NEGATIVE
GLUCOSE, UA: NEGATIVE mg/dL
Ketones, ur: NEGATIVE mg/dL
NITRITE: NEGATIVE
Protein, ur: 100 mg/dL — AB
SPECIFIC GRAVITY, URINE: 1.012 (ref 1.005–1.030)
Squamous Epithelial / LPF: NONE SEEN
pH: 8 (ref 5.0–8.0)

## 2017-04-02 LAB — CBC WITH DIFFERENTIAL/PLATELET
BASOS ABS: 0 10*3/uL (ref 0.0–0.1)
BASOS PCT: 0 %
Eosinophils Absolute: 0.2 10*3/uL (ref 0.0–0.7)
Eosinophils Relative: 1 %
HEMATOCRIT: 40.9 % (ref 39.0–52.0)
Hemoglobin: 13.8 g/dL (ref 13.0–17.0)
LYMPHS PCT: 14 %
Lymphs Abs: 1.9 10*3/uL (ref 0.7–4.0)
MCH: 28.6 pg (ref 26.0–34.0)
MCHC: 33.7 g/dL (ref 30.0–36.0)
MCV: 84.7 fL (ref 78.0–100.0)
Monocytes Absolute: 1.1 10*3/uL — ABNORMAL HIGH (ref 0.1–1.0)
Monocytes Relative: 8 %
NEUTROS ABS: 9.9 10*3/uL — AB (ref 1.7–7.7)
Neutrophils Relative %: 77 %
PLATELETS: 311 10*3/uL (ref 150–400)
RBC: 4.83 MIL/uL (ref 4.22–5.81)
RDW: 15.3 % (ref 11.5–15.5)
WBC: 13 10*3/uL — AB (ref 4.0–10.5)

## 2017-04-02 LAB — I-STAT TROPONIN, ED: Troponin i, poc: 0.02 ng/mL (ref 0.00–0.08)

## 2017-04-02 LAB — COMPREHENSIVE METABOLIC PANEL WITH GFR
ALT: 10 U/L — ABNORMAL LOW (ref 17–63)
AST: 14 U/L — ABNORMAL LOW (ref 15–41)
Albumin: 3.8 g/dL (ref 3.5–5.0)
Alkaline Phosphatase: 66 U/L (ref 38–126)
Anion gap: 10 (ref 5–15)
BUN: 20 mg/dL (ref 6–20)
CO2: 22 mmol/L (ref 22–32)
Calcium: 8.9 mg/dL (ref 8.9–10.3)
Chloride: 103 mmol/L (ref 101–111)
Creatinine, Ser: 1.1 mg/dL (ref 0.61–1.24)
GFR calc Af Amer: >60 mL/min
GFR calc non Af Amer: 59 mL/min — ABNORMAL LOW
Glucose, Bld: 108 mg/dL — ABNORMAL HIGH (ref 65–99)
Potassium: 4 mmol/L (ref 3.5–5.1)
Sodium: 135 mmol/L (ref 135–145)
Total Bilirubin: 1.5 mg/dL — ABNORMAL HIGH (ref 0.3–1.2)
Total Protein: 7.4 g/dL (ref 6.5–8.1)

## 2017-04-02 LAB — PROTIME-INR
INR: 0.95
Prothrombin Time: 12.6 s (ref 11.4–15.2)

## 2017-04-02 LAB — I-STAT CG4 LACTIC ACID, ED: LACTIC ACID, VENOUS: 1.24 mmol/L (ref 0.5–1.9)

## 2017-04-02 MED ORDER — LIDOCAINE HCL (PF) 1 % IJ SOLN
INTRAMUSCULAR | Status: AC
  Filled 2017-04-02: qty 5

## 2017-04-02 MED ORDER — CEPHALEXIN 500 MG PO CAPS: 500.0000 mg | ORAL_CAPSULE | Freq: Three times a day (TID) | ORAL | 0 refills | Status: DC

## 2017-04-02 MED ORDER — CEFTRIAXONE SODIUM 1 G IJ SOLR
1.0000 g | Freq: Once | INTRAMUSCULAR | Status: AC
  Administered 2017-04-02: 1 g via INTRAMUSCULAR
  Filled 2017-04-02: qty 10

## 2017-04-02 NOTE — ED Notes (Signed)
Noted that urology Dr. Matilde Sprang placed a new catheter-see his physician notes

## 2017-04-02 NOTE — ED Notes (Signed)
Bed: [facility] Expected date:  Expected time:  Means of arrival:  Comments: 

## 2017-04-02 NOTE — ED Notes (Signed)
Foley emptied 400 cc's bloody urine with clots, mucous and sediment. Stat lock in place holding foley cath

## 2017-04-02 NOTE — ED Notes (Signed)
Resting quietly in hallway bed with no complaints-administered antibiotic as ordered at 1745. PIV removed.

## 2017-04-02 NOTE — ED Notes (Signed)
UROLOGY Provider at bedside.

## 2017-04-02 NOTE — ED Notes (Signed)
[facility] notified of need for transport back to [facility]

## 2017-04-02 NOTE — ED Triage Notes (Signed)
He comes to [place] from [facility]. Their staff report pt. "pulled out his (chronic) foley catheter; and we can't get the new one to go in". Pt. Arrives here in no distress. He is deaf and aphasic and is comfortable in appearance.

## 2017-04-02 NOTE — ED Notes (Signed)
Report to [facility] to go back to [facility]

## 2017-04-02 NOTE — ED Notes (Signed)
ED Provider at bedside. 

## 2017-04-02 NOTE — ED Notes (Signed)
Patient coming from [facility] for foley catheter placement. After patient had his removed for changing out, had unsuccessful attempts getting back in. PMH of BPH

## 2017-04-02 NOTE — ED Notes (Signed)
Attempt to call report-no one picked up

## 2017-04-02 NOTE — ED Notes (Signed)
Bed: VP03 Expected date:  Expected time:  Means of arrival:  Comments: EMS/For foley placement

## 2017-04-02 NOTE — ED Notes (Signed)
[facility] here to transport patient back to [facility]

## 2017-04-02 NOTE — Consult Note (Signed)
Urology Consult  Referring physician: Marylene Buerger Reason for referral: Unable to place foley  Chief Complaint: Unable to place foley  History of Present Illness: Nursing home patient; seen in past by Dr D. In our office. Pulled out catheter and failed attempts to replace and blood; deaf mute; blind; no history   Modifying factors: There are no other modifying factors  Associated signs and symptoms: There are no other associated signs and symptoms Aggravating and relieving factors: There are no other aggravating or relieving factors Severity: Moderate Duration: Persistent  Past Medical History:  Diagnosis Date  . Adult failure to thrive   . Anemia, unspecified   . Benign prostatic hyperplasia with lower urinary tract symptoms 10/01/2016  . Blind   . CAD (coronary artery disease)   . Chronic kidney disease   . Coronary atherosclerosis 04/10/2006   Qualifier: Diagnosis of  By: Hilma Favors  DO, Beth    . Deaf   . GERD (gastroesophageal reflux disease)   . Hyperkalemia   . Hyperlipidemia   . Hypertension   . Malignant neoplasm of kidney ([place])   . Mute   . Urinary tract infection, site not specified    Past Surgical History:  Procedure Laterality Date  . CHOLECYSTECTOMY    . left partial nephrectomy      Medications: I have reviewed the patient's current medications. Allergies:  Allergies  Allergen Reactions  . Aspirin Other (See Comments)    Reaction:  Unknown     No family history on file. Social History:  reports that he has never smoked. He has never used smokeless tobacco. He reports that he does not drink alcohol or use drugs.  ROS: All systems are reviewed and negative except as noted. Rest negative  Physical Exam:  Vital signs in last 24 hours: Temp:  [100.5 F (38.1 C)] 100.5 F (38.1 C) (10/10 1325) Pulse Rate:  [117-129] 117 (10/10 1625) Resp:  [18-20] 18 (10/10 1625) BP: (182-193)/(96-99) 182/99 (10/10 1625) SpO2:  [99 %-100 %] 99 % (10/10 1625) Weight:   [46.3 kg (102 lb)] 46.3 kg (102 lb) (10/10 1326)  Cardiovascular: Skin warm; not flushed Respiratory: Breaths quiet; no shortness of breath Abdomen: No masses Neurological: Normal sensation to touch Musculoskeletal: Normal motor function arms and legs Lymphatics: No inguinal adenopathy Skin: No rashes Genitourinary:penile edema with a moderate parphimosis/ coronal hypospadia/ blood onscrotum and meatus  Laboratory Data:  Results for orders placed or performed during the hospital encounter of 04/02/17 (from the past 72 hour(s))  Comprehensive metabolic panel     Status: Abnormal   Collection Time: 04/02/17  1:15 PM  Result Value Ref Range   Sodium 135 135 - 145 mmol/L   Potassium 4.0 3.5 - 5.1 mmol/L   Chloride 103 101 - 111 mmol/L   CO2 22 22 - 32 mmol/L   Glucose, Bld 108 (H) 65 - 99 mg/dL   BUN 20 6 - 20 mg/dL   Creatinine, Ser 1.10 0.61 - 1.24 mg/dL   Calcium 8.9 8.9 - 10.3 mg/dL   Total Protein 7.4 6.5 - 8.1 g/dL   Albumin 3.8 3.5 - 5.0 g/dL   AST 14 (L) 15 - 41 U/L   ALT 10 (L) 17 - 63 U/L   Alkaline Phosphatase 66 38 - 126 U/L   Total Bilirubin 1.5 (H) 0.3 - 1.2 mg/dL   GFR calc non Af Amer 59 (L) >60 mL/min   GFR calc Af Amer >60 >60 mL/min    Comment: (NOTE) The eGFR  has been calculated using the CKD EPI equation. This calculation has not been validated in all clinical situations. eGFR's persistently <60 mL/min signify possible Chronic Kidney Disease.    Anion gap 10 5 - 15  CBC with Differential     Status: Abnormal   Collection Time: 04/02/17  1:15 PM  Result Value Ref Range   WBC 13.0 (H) 4.0 - 10.5 K/uL   RBC 4.83 4.22 - 5.81 MIL/uL   Hemoglobin 13.8 13.0 - 17.0 g/dL   HCT 40.9 39.0 - 52.0 %   MCV 84.7 78.0 - 100.0 fL   MCH 28.6 26.0 - 34.0 pg   MCHC 33.7 30.0 - 36.0 g/dL   RDW 15.3 11.5 - 15.5 %   Platelets 311 150 - 400 K/uL   Neutrophils Relative % 77 %   Neutro Abs 9.9 (H) 1.7 - 7.7 K/uL   Lymphocytes Relative 14 %   Lymphs Abs 1.9 0.7 - 4.0 K/uL    Monocytes Relative 8 %   Monocytes Absolute 1.1 (H) 0.1 - 1.0 K/uL   Eosinophils Relative 1 %   Eosinophils Absolute 0.2 0.0 - 0.7 K/uL   Basophils Relative 0 %   Basophils Absolute 0.0 0.0 - 0.1 K/uL  Protime-INR     Status: None   Collection Time: 04/02/17  1:15 PM  Result Value Ref Range   Prothrombin Time 12.6 11.4 - 15.2 seconds   INR 0.95   I-Stat Troponin, ED (not at [facility])     Status: None   Collection Time: 04/02/17  1:22 PM  Result Value Ref Range   Troponin i, poc 0.02 0.00 - 0.08 ng/mL   Comment 3            Comment: Due to the release kinetics of cTnI, a negative result within the first hours of the onset of symptoms does not rule out myocardial infarction with certainty. If myocardial infarction is still suspected, repeat the test at appropriate intervals.   I-Stat CG4 Lactic Acid, ED     Status: None   Collection Time: 04/02/17  1:23 PM  Result Value Ref Range   Lactic Acid, Venous 1.24 0.5 - 1.9 mmol/L   No results found for this or any previous visit (from the past 240 hour(s)). Creatinine:  Recent Labs  04/02/17 1315  CREATININE 1.10    Xrays: See report/chart none  Impression/Assessment:  Retention with urethral trauma  Plan:  16 Fr foley went in easily and urine pink Leave in for at least one month before change; catheter strap applied; low grade temp treated by primary team; reduced paraphimosis moderate- left due to foley/and not bothering patient - tried for several minutes to reduce more and not a surgical candidate  Selyna Klahn A 04/02/2017, 4:34 PM

## 2017-04-02 NOTE — ED Provider Notes (Addendum)
[facility] DEPT Provider Note   CSN: [id number] Arrival date & time: 04/02/17  1242     History   Chief Complaint Chief Complaint  Patient presents with  . Urinary Retention   Level V caveat: aphasic HPI Alexander Rosales is a 83 y.o. male.  HPI Patient is a 83 year old male who presents to the emergency department after pulling out his chronic Foley catheter today.  Staff there was unable to replace one he was sent to the ER for further evaluation.  Level V caveat as the patient is deaf and  Aphasic.   Past Medical History:  Diagnosis Date  . Adult failure to thrive   . Anemia, unspecified   . Benign prostatic hyperplasia with lower urinary tract symptoms 10/01/2016  . Blind   . CAD (coronary artery disease)   . Chronic kidney disease   . Coronary atherosclerosis 04/10/2006   Qualifier: Diagnosis of  By: Hilma Favors  DO, Beth    . Deaf   . GERD (gastroesophageal reflux disease)   . Hyperkalemia   . Hyperlipidemia   . Hypertension   . Malignant neoplasm of kidney ([place])   . Mute   . Urinary tract infection, site not specified     Patient Active Problem List   Diagnosis Date Noted  . Unintentional weight loss of more than 10 pounds in 90 days 02/19/2017  . Penis injury, subsequent encounter 12/17/2016  . Penile edema 11/06/2016  . Cellulitis of penis 10/31/2016  . Hypertensive kidney disease with CKD stage III ([place]) 10/02/2016  . CKD (chronic kidney disease), stage III ([place]) 10/02/2016  . BPH with urinary obstruction 10/02/2016  . Hyperkalemia 09/27/2016  . Back pain 08/20/2011  . Malignant neoplasm of kidney excluding renal pelvis ([place]) 02/07/2007  . Blindness of both eyes 04/10/2006  . Deaf mutism, acquired 04/10/2006  . Essential hypertension, benign 04/10/2006  . Coronary atherosclerosis 04/10/2006  . GERD without esophagitis 04/10/2006    Past Surgical History:  Procedure Laterality Date  . CHOLECYSTECTOMY    . left partial nephrectomy          Home Medications    Prior to Admission medications   Medication Sig Start Date End Date Taking? Authorizing Provider  acetaminophen (TYLENOL) 325 MG tablet Take 650 mg by mouth every 4 (four) hours as needed for mild pain.   Yes [provider]  amLODipine-benazepril (LOTREL) 10-20 MG per capsule Take 1 capsule by mouth daily. 08/19/11  Yes Pedro Earls, MD  Cranberry 450 MG CAPS Take 1 tablet by mouth daily.   Yes [provider]  hydrALAZINE (APRESOLINE) 25 MG tablet Take 1 tablet (25 mg total) by mouth 2 (two) times daily. 08/19/11  Yes Pedro Earls, MD  loperamide (IMODIUM) 2 MG capsule Take 2 mg by mouth daily.   Yes [provider]  metoprolol (LOPRESSOR) 100 MG tablet Take 100 mg by mouth daily.   Yes [provider]  Nutritional Supplements (NUTRITIONAL SUPPLEMENT PO) Take by mouth. House 2.0 - Med Pass -  Give  ml two times daily 120cc by mouth Three times daily   Yes [provider]  ranitidine (ZANTAC) 150 MG tablet Take 150 mg by mouth at bedtime.   Yes [provider]  Tamsulosin HCl (FLOMAX) 0.4 MG CAPS Take 1 capsule (0.4 mg total) by mouth daily. 09/16/11  Yes Pedro Earls, MD    Family History No family history on file.  Social History Social History  Substance Use Topics  . Smoking status:  Never Smoker  . Smokeless tobacco: Never Used  . Alcohol use No     Allergies   Aspirin   Review of Systems Review of Systems  Unable to perform ROS: Patient nonverbal     Physical Exam Updated Vital Signs BP (!) 186/98   Pulse (!) 129   Temp (!) 100.5 F (38.1 C) (Rectal)   Resp 18   Ht 5\' 6"  (1.676 m)   Wt 46.3 kg (102 lb)   SpO2 99%   BMI 16.46 kg/m   Physical Exam  Constitutional: He appears well-developed and well-nourished.  HENT:  Head: Normocephalic and atraumatic.  Eyes: EOM are normal.  Neck: Normal range of motion.  Cardiovascular: Normal rate and regular rhythm.    Pulmonary/Chest: Effort normal. No respiratory distress.  Abdominal: Soft. He exhibits no distension. There is no tenderness.  Genitourinary:  Genitourinary Comments: Normal circumcised penis.  Normal-appearing penile shaft and scrotum.  No blood at the urethral meatus.  Musculoskeletal: Normal range of motion.  Neurological: He is alert.  Skin: Skin is warm and dry.  Psychiatric: He has a normal mood and affect. Judgment normal.  Nursing note and vitals reviewed.    ED Treatments / Results  Labs (all labs ordered are listed, but only abnormal results are displayed) Labs Reviewed  COMPREHENSIVE METABOLIC PANEL - Abnormal; Notable for the following:       Result Value   Glucose, Bld 108 (*)    AST 14 (*)    ALT 10 (*)    Total Bilirubin 1.5 (*)    GFR calc non Af Amer 59 (*)    All other components within normal limits  CBC WITH DIFFERENTIAL/PLATELET - Abnormal; Notable for the following:    WBC 13.0 (*)    Neutro Abs 9.9 (*)    Monocytes Absolute 1.1 (*)    All other components within normal limits  URINALYSIS, ROUTINE W REFLEX MICROSCOPIC - Abnormal; Notable for the following:    Color, Urine RED (*)    APPearance CLOUDY (*)    Hgb urine dipstick LARGE (*)    Protein, ur 100 (*)    Leukocytes, UA LARGE (*)    Bacteria, UA MANY (*)    All other components within normal limits  CULTURE, BLOOD (ROUTINE X 2)  CULTURE, BLOOD (ROUTINE X 2)  PROTIME-INR  I-STAT CG4 LACTIC ACID, ED  I-STAT TROPONIN, ED    EKG  EKG Interpretation None       Radiology Dg Chest 2 View  Result Date: 04/02/2017 CLINICAL DATA:  The patient removed his Foley catheter. EXAM: CHEST  2 VIEW COMPARISON:  Single-view of the chest and CT chest 03/07/2009. FINDINGS: Lungs are clear. Heart size is normal. No pneumothorax or pleural fluid. No bony abnormality. IMPRESSION: Negative chest. Electronically Signed   By: Inge Rise M.D.   On: 04/02/2017 15:20    Procedures BLADDER  CATHETERIZATION Performed by: Jola Schmidt Authorized by: Jola Schmidt   Consent:    Consent obtained: implied. Pre-procedure details:    Procedure purpose:  Therapeutic   Preparation: Patient was prepped and draped in usual sterile fashion   Anesthesia (see MAR for exact dosages):    Anesthesia method:  None Procedure details:    Provider performed due to:  Complicated insertion and nurse unable to complete   Catheter insertion:  Indwelling   Catheter type:  Foley and triple lumen   Catheter size:  22 Fr   Number of attempts:  3 Post-procedure details:  Patient tolerance of procedure:  Tolerated with difficulty Comments:     Unsuccessful placement. Urology consultation required    (including critical care time)  Medications Ordered in ED Medications  cefTRIAXone (ROCEPHIN) injection 1 g (1 g Intramuscular Given 04/02/17 1928)     Initial Impression / Assessment and Plan / ED Course  I have reviewed the triage vital signs and the nursing notes.  Pertinent labs & imaging results that were available during my care of the patient were reviewed by me and considered in my medical decision making (see chart for details).     Unfortunately was unsuccessful and placement of catheter was concerned about the possibility of a false passage that had been created by nursing staff at the nursing home.  Given the amount of blood and therefore any further attempts were discontinued.  Consult urology was able to place a 107 [place] Foley with return of blood tinged urine. Urine culture sent.  Questionable UTI.  Difficult examination given aphasia.  Will cover with antibiotics. Primary team to follow up with urine culture.  Final Clinical Impressions(s) / ED Diagnoses   Final diagnoses:  Acute urinary retention    New Prescriptions New Prescriptions   No medications on file     Jola Schmidt, MD 04/03/17 [place], Jaaziel Peatross, MD 04/25/17 [phone]

## 2017-04-03 DIAGNOSIS — R627 Adult failure to thrive: Secondary | ICD-10-CM | POA: Diagnosis not present

## 2017-04-04 DIAGNOSIS — R627 Adult failure to thrive: Secondary | ICD-10-CM | POA: Diagnosis not present

## 2017-04-07 DIAGNOSIS — R627 Adult failure to thrive: Secondary | ICD-10-CM | POA: Diagnosis not present

## 2017-04-07 LAB — CULTURE, BLOOD (ROUTINE X 2)
CULTURE: NO GROWTH
CULTURE: NO GROWTH
SPECIAL REQUESTS: ADEQUATE
SPECIAL REQUESTS: ADEQUATE

## 2017-04-08 DIAGNOSIS — R627 Adult failure to thrive: Secondary | ICD-10-CM | POA: Diagnosis not present

## 2017-04-09 DIAGNOSIS — R627 Adult failure to thrive: Secondary | ICD-10-CM | POA: Diagnosis not present

## 2017-04-10 DIAGNOSIS — R627 Adult failure to thrive: Secondary | ICD-10-CM | POA: Diagnosis not present

## 2017-04-11 DIAGNOSIS — R627 Adult failure to thrive: Secondary | ICD-10-CM | POA: Diagnosis not present

## 2017-04-14 DIAGNOSIS — R627 Adult failure to thrive: Secondary | ICD-10-CM | POA: Diagnosis not present

## 2017-04-15 DIAGNOSIS — R627 Adult failure to thrive: Secondary | ICD-10-CM | POA: Diagnosis not present

## 2017-04-16 DIAGNOSIS — R627 Adult failure to thrive: Secondary | ICD-10-CM | POA: Diagnosis not present

## 2017-04-25 ENCOUNTER — Encounter: Payer: Self-pay | Admitting: Adult Health

## 2017-04-25 ENCOUNTER — Non-Acute Institutional Stay (SKILLED_NURSING_FACILITY): Payer: Medicare Other | Admitting: Adult Health

## 2017-04-25 DIAGNOSIS — K529 Noninfective gastroenteritis and colitis, unspecified: Secondary | ICD-10-CM | POA: Diagnosis not present

## 2017-04-25 DIAGNOSIS — K219 Gastro-esophageal reflux disease without esophagitis: Secondary | ICD-10-CM | POA: Diagnosis not present

## 2017-04-25 DIAGNOSIS — I25119 Atherosclerotic heart disease of native coronary artery with unspecified angina pectoris: Secondary | ICD-10-CM

## 2017-04-25 DIAGNOSIS — I10 Essential (primary) hypertension: Secondary | ICD-10-CM

## 2017-04-25 DIAGNOSIS — I209 Angina pectoris, unspecified: Secondary | ICD-10-CM

## 2017-04-25 NOTE — Progress Notes (Signed)
Location:   [facility] Room Number: 102 A Place of Service:  SNF (31)   CODE STATUS: DNR  Allergies  Allergen Reactions  . Aspirin Other (See Comments)    Reaction:  Unknown     Chief Complaint  Patient presents with  . Medical Management of Chronic Issues    Hypertension; cad; gerd; chronic diarrhea    HPI:  He is a 83 year old long term resident of this facility being seen for the management of his chronic illnesses: benign essential hypertension; coronary atherosclerosis; gerd without esophagitis; chronic diarrhea. He is unable to participate in the hpi or ros. There are no reports of constipation present; there are no reports of headaches or other pains; there are no reports of changes in appetite. There are no nursing concerns at this time.   Past Medical History:  Diagnosis Date  . Adult failure to thrive   . Anemia, unspecified   . Benign prostatic hyperplasia with lower urinary tract symptoms 10/01/2016  . Blind   . CAD (coronary artery disease)   . Chronic kidney disease   . Coronary atherosclerosis 04/10/2006   Qualifier: Diagnosis of  By: Hilma Favors  DO, Beth    . Deaf   . GERD (gastroesophageal reflux disease)   . Hyperkalemia   . Hyperlipidemia   . Hypertension   . Malignant neoplasm of kidney ([place])   . Mute   . Urinary tract infection, site not specified     Past Surgical History:  Procedure Laterality Date  . CHOLECYSTECTOMY    . left partial nephrectomy      Social History   Social History  . Marital status: Single    Spouse name: N/A  . Number of children: N/A  . Years of education: N/A   Occupational History  . Not on file.   Social History Main Topics  . Smoking status: Never Smoker  . Smokeless tobacco: Never Used  . Alcohol use No  . Drug use: No  . Sexual activity: Not on file   Other Topics Concern  . Not on file   Social History Narrative  . No narrative on file   History reviewed. No pertinent family  history.    VITAL SIGNS BP (!) 122/54   Pulse 72   Temp 98.1 F (36.7 C)   Resp 18   Ht 5\' 6"  (1.676 m)   Wt 102 lb (46.3 kg)   SpO2 98%   BMI 16.46 kg/m   Patient's Medications  New Prescriptions   No medications on file  Previous Medications   ACETAMINOPHEN (TYLENOL) 325 MG TABLET    Take 650 mg by mouth every 4 (four) hours as needed for mild pain.   AMLODIPINE-BENAZEPRIL (LOTREL) 10-20 MG PER CAPSULE    Take 1 capsule by mouth daily.   CRANBERRY 450 MG CAPS    Take 1 tablet by mouth daily.   HYDRALAZINE (APRESOLINE) 25 MG TABLET    Take 1 tablet (25 mg total) by mouth 2 (two) times daily.   LOPERAMIDE (IMODIUM) 2 MG CAPSULE    Take 2 mg by mouth daily.   METOPROLOL (LOPRESSOR) 100 MG TABLET    Take 100 mg by mouth daily.   NUTRITIONAL SUPPLEMENTS (NUTRITIONAL SUPPLEMENT PO)    Take by mouth. House 2.0 - [facility] -  Give  ml two times daily 120cc by mouth Three times daily   RANITIDINE (ZANTAC) 150 MG TABLET    Take 150 mg by mouth at bedtime.  TAMSULOSIN HCL (FLOMAX) 0.4 MG CAPS    Take 1 capsule (0.4 mg total) by mouth daily.  Modified Medications   No medications on file  Discontinued Medications   No medications on file     SIGNIFICANT DIAGNOSTIC EXAMS  PREVIOUS  09-28-16: renal ultrasound: 1. Bilateral hydronephrosis, at least moderate in degree bilaterally. Consider CT for further characterization. 2. Bladder decompressed by Foley catheter. Questionable bladder wall thickening.  NO NEW EXAMS    LABS REVIEWED: PREVIOUS  09-27-16: wbc 13.2; hgb 11.7; hct 33.3; mcv 84.7; plt 185; glucose 116; bun 176; creat 24.64; k+ 6.4; na++ 130; liver normal albumin 3.5: urine culture: no growth 09-29-16; wbc 12.1; hgb 10.7; hct 31.3; mcv 87.4; plt 155; glucose 107; bun 50; creat 2.44; k+ 3.1; na++ 139; liver normal albumin 2.9 10-01-16: glucose 131; bun 14; creat 1.17; k+ 3.8; na++ 131; (creat clear 30.51) mag 1.8  12-24-16: glucose 94; bun 15.8; creat 1.05; k+ 4.0; na++ 136; ca  8.6; liver normal albumin 3.7   TODAY:   02-20-17: wbc 8.2; hgb 13.7; hct 41.9; mcv 86.8; plt 253; glucose 105; bun 11.1; creat 0.80; k+ 4.1; na++ 140; ca 9.1; liver normal albumin 3.8; vit B 12: 654; tsh 2.05    Review of Systems  Unable to perform ROS: Other (deaf mute blind )   Physical Exam  Constitutional: No distress.  Frail   HENT:  Is deaf; is mute   Eyes:  Blind  Neck: Neck supple. No thyromegaly present.  Cardiovascular: Normal rate, regular rhythm, normal heart sounds and intact distal pulses.  Pulmonary/Chest: Effort normal and breath sounds normal. No respiratory distress.  Abdominal: Soft. Bowel sounds are normal. He exhibits no distension. There is no tenderness.  Genitourinary:  Genitourinary Comments: Foley has penile tear   Musculoskeletal: He exhibits no edema.  Able to move all extremities   Lymphadenopathy:    He has no cervical adenopathy.  Neurological:  Is aware   Skin: Skin is warm and dry. He is not diaphoretic.       ASSESSMENT/ PLAN:  TODAY:   1. Hypertension: b/p 122/54 is stable  will continue lotrel 10/20 mg daily apresoline 25 mg twice daily; lopressor 100 mg daily.   2. CAD: is presently stable will continue lopressor 100 mg daily   3. gerd: will continue zantac 150 mg daily   4. Chronic diarrhea: stable  is taking imodium 2 mg daily will monitor   PREVIOUS   5. BPH with urine retention: has foley in place is stable  will continue flomax 0.4 mg daily   6. Chronic renal disease stage III; has a history of malignant neoplasm of left kidney: is status post partial  left nephrectomy: bun 11.1; creat 0.80  is stable  7. Failure to thrive: weight is 102 pounds; has completed remeron therapy. Has improved from 99 pounds; will monitor  Is blind deaf and mute    MD is aware of resident's narcotic use and is in agreement with current plan of care. We will attempt to wean resident as apropriate     Ok Edwards NP [facility] Adult  Medicine  Contact [phone] Monday through Friday 8am- 5pm  After hours call [phone]

## 2017-04-28 DIAGNOSIS — I739 Peripheral vascular disease, unspecified: Secondary | ICD-10-CM | POA: Diagnosis not present

## 2017-04-28 DIAGNOSIS — B351 Tinea unguium: Secondary | ICD-10-CM | POA: Diagnosis not present

## 2017-05-15 DIAGNOSIS — K529 Noninfective gastroenteritis and colitis, unspecified: Secondary | ICD-10-CM | POA: Insufficient documentation

## 2017-05-28 ENCOUNTER — Non-Acute Institutional Stay (SKILLED_NURSING_FACILITY): Payer: Medicare Other | Admitting: Adult Health

## 2017-05-28 ENCOUNTER — Encounter: Payer: Self-pay | Admitting: Adult Health

## 2017-05-28 DIAGNOSIS — N183 Chronic kidney disease, stage 3 unspecified: Secondary | ICD-10-CM

## 2017-05-28 DIAGNOSIS — R627 Adult failure to thrive: Secondary | ICD-10-CM | POA: Diagnosis not present

## 2017-05-28 DIAGNOSIS — I129 Hypertensive chronic kidney disease with stage 1 through stage 4 chronic kidney disease, or unspecified chronic kidney disease: Secondary | ICD-10-CM

## 2017-05-28 DIAGNOSIS — C642 Malignant neoplasm of left kidney, except renal pelvis: Secondary | ICD-10-CM

## 2017-05-28 NOTE — Progress Notes (Signed)
Location:   [facility] Room Number: 102 A Place of Service:  SNF (31)   CODE STATUS: DNR  Allergies  Allergen Reactions  . Aspirin Other (See Comments)    Reaction:  Unknown     Chief Complaint  Patient presents with  . Acute Visit    Care Plan Meeting    HPI:  We have come together with family and care plan team for his routine care plan. The nursing concerns have been addressed. We have discussed his care plan. We did discuss his overall health status. We did discuss his MOST form. We have made changes to include no feeding tube. We did discuss his code status; treatment options. The pros and cons of decisions made. His family has verbalized understanding.   Past Medical History:  Diagnosis Date  . Adult failure to thrive   . Anemia, unspecified   . Benign prostatic hyperplasia with lower urinary tract symptoms 10/01/2016  . Blind   . CAD (coronary artery disease)   . Chronic kidney disease   . Coronary atherosclerosis 04/10/2006   Qualifier: Diagnosis of  By: Hilma Favors  DO, Beth    . Deaf   . GERD (gastroesophageal reflux disease)   . Hyperkalemia   . Hyperlipidemia   . Hypertension   . Malignant neoplasm of kidney ([place])   . Mute   . Urinary tract infection, site not specified     Past Surgical History:  Procedure Laterality Date  . CHOLECYSTECTOMY    . left partial nephrectomy      Social History   Socioeconomic History  . Marital status: Single    Spouse name: Not on file  . Number of children: Not on file  . Years of education: Not on file  . Highest education level: Not on file  Social Needs  . Financial resource strain: Not on file  . Food insecurity - worry: Not on file  . Food insecurity - inability: Not on file  . Transportation needs - medical: Not on file  . Transportation needs - non-medical: Not on file  Occupational History  . Not on file  Tobacco Use  . Smoking status: Never Smoker  . Smokeless tobacco: Never Used    Substance and Sexual Activity  . Alcohol use: No  . Drug use: No  . Sexual activity: Not on file  Other Topics Concern  . Not on file  Social History Narrative  . Not on file   History reviewed. No pertinent family history.    VITAL SIGNS BP 124/80   Pulse 80   Temp (!) 97.3 F (36.3 C)   Resp 18   Ht 5\' 6"  (1.676 m)   Wt 104 lb 9.6 oz (47.4 kg)   SpO2 99%   BMI 16.88 kg/m   Outpatient Encounter Medications as of 05/28/2017  Medication Sig  . acetaminophen (TYLENOL) 325 MG tablet Take 650 mg by mouth every 4 (four) hours as needed for mild pain.  Marland Kitchen amLODipine-benazepril (LOTREL) 10-20 MG per capsule Take 1 capsule by mouth daily.  . Cranberry 450 MG CAPS Take 1 tablet by mouth daily.  . hydrALAZINE (APRESOLINE) 25 MG tablet Take 1 tablet (25 mg total) by mouth 2 (two) times daily.  Marland Kitchen loperamide (IMODIUM) 2 MG capsule Take 2 mg by mouth daily.  . metoprolol (LOPRESSOR) 100 MG tablet Take 100 mg by mouth daily.  . Nutritional Supplements (NUTRITIONAL SUPPLEMENT PO) Take by mouth. House 2.0 - [facility] -  Give  ml two times daily 120cc by mouth Three times daily  . ranitidine (ZANTAC) 150 MG tablet Take 150 mg by mouth at bedtime.  . Tamsulosin HCl (FLOMAX) 0.4 MG CAPS Take 1 capsule (0.4 mg total) by mouth daily.   No facility-administered encounter medications on file as of 05/28/2017.      SIGNIFICANT DIAGNOSTIC EXAMS  PREVIOUS  09-28-16: renal ultrasound: 1. Bilateral hydronephrosis, at least moderate in degree bilaterally. Consider CT for further characterization. 2. Bladder decompressed by Foley catheter. Questionable bladder wall thickening.  NO NEW EXAMS    LABS REVIEWED: PREVIOUS  09-27-16: wbc 13.2; hgb 11.7; hct 33.3; mcv 84.7; plt 185; glucose 116; bun 176; creat 24.64; k+ 6.4; na++ 130; liver normal albumin 3.5: urine culture: no growth 09-29-16; wbc 12.1; hgb 10.7; hct 31.3; mcv 87.4; plt 155; glucose 107; bun 50; creat 2.44; k+ 3.1; na++ 139; liver normal  albumin 2.9 10-01-16: glucose 131; bun 14; creat 1.17; k+ 3.8; na++ 131; (creat clear 30.51) mag 1.8  12-24-16: glucose 94; bun 15.8; creat 1.05; k+ 4.0; na++ 136; ca 8.6; liver normal albumin 3.7  02-20-17: wbc 8.2; hgb 13.7; hct 41.9; mcv 86.8; plt 253; glucose 105; bun 11.1; creat 0.80; k+ 4.1; na++ 140; ca 9.1; liver normal albumin 3.8; vit B 12: 654; tsh 2.0544  NO NEW LABS    Review of Systems  Unable to perform ROS: Other (deaf mute blind )    Physical Exam  Constitutional: He is oriented to person, place, and time. No distress.  frail  Neck: Neck supple. No thyromegaly present.  Cardiovascular: Normal rate, regular rhythm, normal heart sounds and intact distal pulses.  Pulmonary/Chest: Effort normal and breath sounds normal. No respiratory distress.  Abdominal: Soft. Bowel sounds are normal. He exhibits no distension. There is no tenderness.  Genitourinary:  Genitourinary Comments: Has foley; penile tear and chronic edema present   Musculoskeletal: Normal range of motion. He exhibits no edema.  Lymphadenopathy:    He has no cervical adenopathy.  Neurological: He is alert and oriented to person, place, and time.  Skin: Skin is warm and dry. He is not diaphoretic.  Psychiatric: He has a normal mood and affect.   ASSESSMENT/ PLAN:  TODAY:   1. Hypertensive kidney disease with CKD stage III 2. Malignant neoplasm of kidney excluding renal pelvis 3. Failure to thrive in adult  Will continue his current plan of care.  Will not medication changes at this time  Time spent with family and patient 40 minutes: (20 minutes spent with advanced care planning). Discussed and changed MOST form to include no feeding tube; discussed care plan; health status; and medications; has verbalized understanding.        MD is aware of resident's narcotic use and is in agreement with current plan of care. We will attempt to wean resident as apropriate     Ok Edwards NP [facility] Adult  Medicine  Contact [phone] Monday through Friday 8am- 5pm  After hours call [phone]

## 2017-06-06 ENCOUNTER — Non-Acute Institutional Stay (SKILLED_NURSING_FACILITY): Payer: Medicare Other | Admitting: Adult Health

## 2017-06-06 ENCOUNTER — Encounter: Payer: Self-pay | Admitting: Adult Health

## 2017-06-06 DIAGNOSIS — N401 Enlarged prostate with lower urinary tract symptoms: Secondary | ICD-10-CM | POA: Diagnosis not present

## 2017-06-06 DIAGNOSIS — N183 Chronic kidney disease, stage 3 unspecified: Secondary | ICD-10-CM

## 2017-06-06 DIAGNOSIS — R627 Adult failure to thrive: Secondary | ICD-10-CM | POA: Diagnosis not present

## 2017-06-06 DIAGNOSIS — N138 Other obstructive and reflux uropathy: Secondary | ICD-10-CM

## 2017-06-06 NOTE — Progress Notes (Signed)
Patient ID: Alexander Rosales, male   DOB: 1930/02/01, 83 y.o.   MRN: [id number]  Location:   [facility] Room Number: 102 A Place of Service:  SNF (31)   CODE STATUS: DNR  Allergies  Allergen Reactions  . Aspirin Other (See Comments)    Reaction:  Unknown     Chief Complaint  Patient presents with  . Medical Management of Chronic Issues    Bph; urine retention; ckd stage III; ftt    HPI:  He is a 83 year old long term resident of this facility being seen for the management of his chronic illnesses: CKD stage III; BPH with urinary obstruction; failure to thrive in adult. He is unable to participate in the hpi or ros. There are no reports of changes in appetite; no changes in behaviors; no indications of pain present. There are no nursing concerns at this time    Past Medical History:  Diagnosis Date  . Adult failure to thrive   . Anemia, unspecified   . Benign prostatic hyperplasia with lower urinary tract symptoms 10/01/2016  . Blind   . CAD (coronary artery disease)   . Chronic kidney disease   . Coronary atherosclerosis 04/10/2006   Qualifier: Diagnosis of  By: Hilma Favors  DO, Beth    . Deaf   . GERD (gastroesophageal reflux disease)   . Hyperkalemia   . Hyperlipidemia   . Hypertension   . Malignant neoplasm of kidney ([place])   . Mute   . Urinary tract infection, site not specified     Past Surgical History:  Procedure Laterality Date  . CHOLECYSTECTOMY    . left partial nephrectomy      Social History   Socioeconomic History  . Marital status: Single    Spouse name: Not on file  . Number of children: Not on file  . Years of education: Not on file  . Highest education level: Not on file  Social Needs  . Financial resource strain: Not on file  . Food insecurity - worry: Not on file  . Food insecurity - inability: Not on file  . Transportation needs - medical: Not on file  . Transportation needs - non-medical: Not on file  Occupational History  .  Not on file  Tobacco Use  . Smoking status: Never Smoker  . Smokeless tobacco: Never Used  Substance and Sexual Activity  . Alcohol use: No  . Drug use: No  . Sexual activity: Not on file  Other Topics Concern  . Not on file  Social History Narrative  . Not on file   History reviewed. No pertinent family history.    VITAL SIGNS BP 135/76   Pulse 66   Temp (!) 97.3 F (36.3 C) (Oral)   Resp 18   Ht 5\' 6"  (1.676 m)   Wt 105 lb 3.2 oz (47.7 kg)   SpO2 99%   BMI 16.98 kg/m   Outpatient Encounter Medications as of 06/06/2017  Medication Sig  . acetaminophen (TYLENOL) 325 MG tablet Take 650 mg by mouth every 4 (four) hours as needed for mild pain.  Marland Kitchen amLODipine-benazepril (LOTREL) 10-20 MG per capsule Take 1 capsule by mouth daily.  . Cranberry 450 MG CAPS Take 1 tablet by mouth daily.  . hydrALAZINE (APRESOLINE) 25 MG tablet Take 1 tablet (25 mg total) by mouth 2 (two) times daily.  Marland Kitchen loperamide (IMODIUM) 2 MG capsule Take 2 mg by mouth daily.  . metoprolol (LOPRESSOR) 100 MG tablet  Take 100 mg by mouth daily.  . Nutritional Supplements (NUTRITIONAL SUPPLEMENT PO) Take by mouth. House 2.0 - [facility] -  Give  ml two times daily 120cc by mouth Three times daily  . ranitidine (ZANTAC) 150 MG tablet Take 150 mg by mouth at bedtime.  . Tamsulosin HCl (FLOMAX) 0.4 MG CAPS Take 1 capsule (0.4 mg total) by mouth daily.   No facility-administered encounter medications on file as of 06/06/2017.      SIGNIFICANT DIAGNOSTIC EXAMS  PREVIOUS  09-28-16: renal ultrasound: 1. Bilateral hydronephrosis, at least moderate in degree bilaterally. Consider CT for further characterization. 2. Bladder decompressed by Foley catheter. Questionable bladder wall thickening.  NO NEW EXAMS    LABS REVIEWED: PREVIOUS  09-27-16: wbc 13.2; hgb 11.7; hct 33.3; mcv 84.7; plt 185; glucose 116; bun 176; creat 24.64; k+ 6.4; na++ 130; liver normal albumin 3.5: urine culture: no growth 09-29-16; wbc 12.1; hgb  10.7; hct 31.3; mcv 87.4; plt 155; glucose 107; bun 50; creat 2.44; k+ 3.1; na++ 139; liver normal albumin 2.9 10-01-16: glucose 131; bun 14; creat 1.17; k+ 3.8; na++ 131; (creat clear 30.51) mag 1.8  12-24-16: glucose 94; bun 15.8; creat 1.05; k+ 4.0; na++ 136; ca 8.6; liver normal albumin 3.7  02-20-17: wbc 8.2; hgb 13.7; hct 41.9; mcv 86.8; plt 253; glucose 105; bun 11.1; creat 0.80; k+ 4.1; na++ 140; ca 9.1; liver normal albumin 3.8; vit B 12: 654; tsh 2.05  NO NEW LABS.    Review of Systems  Unable to perform ROS: Other (deaf mute; blind )    Physical Exam  Constitutional: No distress.  Frail   HENT:  Is deaf mute   Eyes:  Is blind   Neck: No thyromegaly present.  Cardiovascular: Normal rate, regular rhythm, normal heart sounds and intact distal pulses.  Pulmonary/Chest: Effort normal and breath sounds normal. No respiratory distress.  Abdominal: Soft. Bowel sounds are normal. He exhibits no distension. There is no tenderness.  Genitourinary:  Genitourinary Comments: Foley; has penile tear  Has chronic penile edema   Musculoskeletal: He exhibits no edema.  Lymphadenopathy:    He has no cervical adenopathy.  Neurological: He is alert.  Skin: Skin is warm and dry. He is not diaphoretic.  Psychiatric: He has a normal mood and affect.    ASSESSMENT/ PLAN:  TODAY:   1. BPH with urine retention: has foley in place is stable  will continue flomax 0.4 mg daily   2. Chronic renal disease stage III; has a history of malignant neoplasm of left kidney: is status post partial  left nephrectomy: bun 11.1; creat 0.80  is stable  3. Failure to thrive: weight is 105 pounds; has completed remeron therapy. Has improved from 99 pounds; will monitor  Is blind deaf and mute   PREVIOUS   4. Hypertension: b/p 135/76 is stable  will continue lotrel 10/20 mg daily apresoline 25 mg twice daily; lopressor 100 mg daily.   5. CAD: is presently stable will continue lopressor 100 mg daily   6.  gerd: will continue zantac 150 mg daily   7. Chronic diarrhea: stable  is taking imodium 2 mg daily will monitor     MD is aware of resident's narcotic use and is in agreement with current plan of care. We will attempt to wean resident as apropriate   Ok Edwards NP [facility] Adult Medicine  Contact [phone] Monday through Friday 8am- 5pm  After hours call [phone]

## 2017-06-07 DIAGNOSIS — Z79899 Other long term (current) drug therapy: Secondary | ICD-10-CM | POA: Diagnosis not present

## 2017-06-07 DIAGNOSIS — R634 Abnormal weight loss: Secondary | ICD-10-CM | POA: Diagnosis not present

## 2017-06-07 DIAGNOSIS — E785 Hyperlipidemia, unspecified: Secondary | ICD-10-CM | POA: Diagnosis not present

## 2017-06-07 LAB — HEPATIC FUNCTION PANEL
ALK PHOS: 61 (ref 25–125)
ALT: 8 — AB (ref 10–40)
AST: 10 — AB (ref 14–40)
Bilirubin, Total: 0.7

## 2017-06-07 LAB — LIPID PANEL
CHOLESTEROL: 118 (ref 0–200)
HDL: 40 (ref 35–70)
LDL CALC: 59
TRIGLYCERIDES: 95 (ref 40–160)

## 2017-06-07 LAB — BASIC METABOLIC PANEL
BUN: 20 (ref 4–21)
CREATININE: 1 (ref 0.6–1.3)
Glucose: 94
Potassium: 4.5 (ref 3.4–5.3)
Sodium: 145 (ref 137–147)

## 2017-06-09 ENCOUNTER — Encounter (HOSPITAL_COMMUNITY): Payer: Self-pay

## 2017-06-09 ENCOUNTER — Emergency Department (HOSPITAL_COMMUNITY)
Admission: EM | Admit: 2017-06-09 | Discharge: 2017-06-09 | Disposition: A | Discharge status: Home / Self Care | Payer: Medicare Other | Attending: Emergency Medicine | Admitting: Emergency Medicine

## 2017-06-09 ENCOUNTER — Other Ambulatory Visit: Payer: Self-pay

## 2017-06-09 DIAGNOSIS — H913 Deaf nonspeaking, not elsewhere classified: Secondary | ICD-10-CM | POA: Insufficient documentation

## 2017-06-09 DIAGNOSIS — N183 Chronic kidney disease, stage 3 (moderate): Secondary | ICD-10-CM | POA: Insufficient documentation

## 2017-06-09 DIAGNOSIS — T839XXA Unspecified complication of genitourinary prosthetic device, implant and graft, initial encounter: Secondary | ICD-10-CM

## 2017-06-09 DIAGNOSIS — Z79899 Other long term (current) drug therapy: Secondary | ICD-10-CM | POA: Insufficient documentation

## 2017-06-09 DIAGNOSIS — N4889 Other specified disorders of penis: Secondary | ICD-10-CM

## 2017-06-09 DIAGNOSIS — B999 Unspecified infectious disease: Secondary | ICD-10-CM | POA: Diagnosis not present

## 2017-06-09 DIAGNOSIS — T83098A Other mechanical complication of other indwelling urethral catheter, initial encounter: Secondary | ICD-10-CM | POA: Diagnosis not present

## 2017-06-09 DIAGNOSIS — R402421 Glasgow coma scale score 9-12, in the field [EMT or ambulance]: Secondary | ICD-10-CM | POA: Diagnosis not present

## 2017-06-09 DIAGNOSIS — R627 Adult failure to thrive: Secondary | ICD-10-CM | POA: Insufficient documentation

## 2017-06-09 DIAGNOSIS — S3994XA Unspecified injury of external genitals, initial encounter: Secondary | ICD-10-CM | POA: Diagnosis not present

## 2017-06-09 DIAGNOSIS — I129 Hypertensive chronic kidney disease with stage 1 through stage 4 chronic kidney disease, or unspecified chronic kidney disease: Secondary | ICD-10-CM | POA: Diagnosis not present

## 2017-06-09 DIAGNOSIS — I2510 Atherosclerotic heart disease of native coronary artery without angina pectoris: Secondary | ICD-10-CM | POA: Insufficient documentation

## 2017-06-09 DIAGNOSIS — R339 Retention of urine, unspecified: Secondary | ICD-10-CM | POA: Diagnosis not present

## 2017-06-09 DIAGNOSIS — Y738 Miscellaneous gastroenterology and urology devices associated with adverse incidents, not elsewhere classified: Secondary | ICD-10-CM | POA: Diagnosis not present

## 2017-06-09 DIAGNOSIS — R100 Acute abdomen: Secondary | ICD-10-CM | POA: Diagnosis not present

## 2017-06-09 DIAGNOSIS — T83091A Other mechanical complication of indwelling urethral catheter, initial encounter: Secondary | ICD-10-CM | POA: Diagnosis not present

## 2017-06-09 LAB — CBC WITH DIFFERENTIAL/PLATELET
Basophils Absolute: 0 10*3/uL (ref 0.0–0.1)
Basophils Relative: 0 %
EOS ABS: 0.3 10*3/uL (ref 0.0–0.7)
EOS PCT: 2 %
HCT: 37.8 % — ABNORMAL LOW (ref 39.0–52.0)
Hemoglobin: 12.7 g/dL — ABNORMAL LOW (ref 13.0–17.0)
LYMPHS ABS: 3.5 10*3/uL (ref 0.7–4.0)
LYMPHS PCT: 34 %
MCH: 28.1 pg (ref 26.0–34.0)
MCHC: 33.6 g/dL (ref 30.0–36.0)
MCV: 83.6 fL (ref 78.0–100.0)
MONO ABS: 0.7 10*3/uL (ref 0.1–1.0)
MONOS PCT: 6 %
Neutro Abs: 5.9 10*3/uL (ref 1.7–7.7)
Neutrophils Relative %: 58 %
PLATELETS: 292 10*3/uL (ref 150–400)
RBC: 4.52 MIL/uL (ref 4.22–5.81)
RDW: 13.1 % (ref 11.5–15.5)
WBC: 10.3 10*3/uL (ref 4.0–10.5)

## 2017-06-09 LAB — URINALYSIS, ROUTINE W REFLEX MICROSCOPIC
Glucose, UA: NEGATIVE mg/dL
Hgb urine dipstick: NEGATIVE
KETONES UR: NEGATIVE mg/dL
Nitrite: NEGATIVE
PH: 9 — AB (ref 5.0–8.0)
Protein, ur: >=300 mg/dL — AB
SPECIFIC GRAVITY, URINE: 1.019 (ref 1.005–1.030)
SQUAMOUS EPITHELIAL / LPF: NONE SEEN

## 2017-06-09 LAB — COMPREHENSIVE METABOLIC PANEL
ALBUMIN: 3.5 g/dL (ref 3.5–5.0)
ALT: 13 U/L — AB (ref 17–63)
ANION GAP: 7 (ref 5–15)
AST: 18 U/L (ref 15–41)
Alkaline Phosphatase: 67 U/L (ref 38–126)
BUN: 22 mg/dL — ABNORMAL HIGH (ref 6–20)
CHLORIDE: 108 mmol/L (ref 101–111)
CO2: 25 mmol/L (ref 22–32)
Calcium: 9 mg/dL (ref 8.9–10.3)
Creatinine, Ser: 1.04 mg/dL (ref 0.61–1.24)
GFR calc non Af Amer: >60 mL/min (ref >60)
GLUCOSE: 93 mg/dL (ref 65–99)
Potassium: 3.7 mmol/L (ref 3.5–5.1)
SODIUM: 140 mmol/L (ref 135–145)
Total Bilirubin: 0.6 mg/dL (ref 0.3–1.2)
Total Protein: 7.2 g/dL (ref 6.5–8.1)

## 2017-06-09 LAB — I-STAT CG4 LACTIC ACID, ED: LACTIC ACID, VENOUS: 0.94 mmol/L (ref 0.5–1.9)

## 2017-06-09 MED ORDER — HYDROMORPHONE HCL 1 MG/ML IJ SOLN
0.5000 mg | Freq: Once | INTRAMUSCULAR | Status: AC
  Administered 2017-06-09: 0.5 mg via INTRAVENOUS
  Filled 2017-06-09: qty 1

## 2017-06-09 NOTE — ED Notes (Signed)
Bed: WA06 Expected date:  Expected time:  Means of arrival:  Comments: 

## 2017-06-09 NOTE — Consult Note (Signed)
Urology Consult Note   Requesting Attending Physician:  Daleen Bo, MD Service Providing Consult: Urology  Consulting Attending: Jeffie Pollock   Reason for Consult:  Paraphimosis, Dysfunctional catheter  HPI: Alexander Rosales is seen in consultation for reasons noted above at the request of Daleen Bo, MD for evaluation of paraphimosis and dysfunctional catheter. .  This is a 83 y.o. male with hx of BPH and urinary retention with chronic indwelling catheter. He is deaf blind and presents after his catheter was not draining at his facility. Urine was noted coming around the catheter.   Brought to [facility] ED. AF, VSS. WBC 10.3, Cr 1.04. U/A with large LE, nitrite negative, TNTC WBCN, and many bacteria.   Urology called given significant penile edema and need for catheter exchange.   Catheter exchanged easily, please see procedure note below.   Patient noted to have paraphimosis. Also noted during last catheter exchange in October by Dr Matilde Sprang.  No evidence of ischemia or pain from paraphimosis. Multiple attempts were made to reduce the paraphimosis and rid the glans of edema but were unsuccessful.   Of note, saw Dr. Alinda Money in 2009 for gross hematuria and elevated PSA. Most recently saw him in 12/2016 for gross hematuria.   Past Medical History: Past Medical History:  Diagnosis Date  . Adult failure to thrive   . Anemia, unspecified   . Benign prostatic hyperplasia with lower urinary tract symptoms 10/01/2016  . Blind   . CAD (coronary artery disease)   . Chronic kidney disease   . Coronary atherosclerosis 04/10/2006   Qualifier: Diagnosis of  By: Hilma Favors  DO, Beth    . Deaf   . GERD (gastroesophageal reflux disease)   . Hyperkalemia   . Hyperlipidemia   . Hypertension   . Malignant neoplasm of kidney ([place])   . Mute   . Urinary tract infection, site not specified     Past Surgical History:  Past Surgical History:  Procedure Laterality Date  . CHOLECYSTECTOMY    . left partial  nephrectomy      Medication: No current facility-administered medications for this encounter.    Current Outpatient Medications  Medication Sig Dispense Refill  . amLODipine-benazepril (LOTREL) 10-20 MG per capsule Take 1 capsule by mouth daily. 30 capsule 6  . Cranberry 450 MG CAPS Take 1 tablet by mouth daily.    . hydrALAZINE (APRESOLINE) 25 MG tablet Take 1 tablet (25 mg total) by mouth 2 (two) times daily. 60 tablet 6  . loperamide (IMODIUM) 2 MG capsule Take 2 mg by mouth daily.    . metoprolol (LOPRESSOR) 100 MG tablet Take 100 mg by mouth daily.    . Nutritional Supplements (NUTRITIONAL SUPPLEMENT PO) Take by mouth. House 2.0 - [facility] -  Give  ml two times daily 120cc by mouth Three times daily    . ranitidine (ZANTAC) 150 MG tablet Take 150 mg by mouth at bedtime.    . Tamsulosin HCl (FLOMAX) 0.4 MG CAPS Take 1 capsule (0.4 mg total) by mouth daily. 30 capsule 6  . acetaminophen (TYLENOL) 325 MG tablet Take 650 mg by mouth every 4 (four) hours as needed for mild pain.      Allergies: Allergies  Allergen Reactions  . Aspirin Other (See Comments)    Reaction:  Unknown     Social History: Social History   Tobacco Use  . Smoking status: Never Smoker  . Smokeless tobacco: Never Used  Substance Use Topics  . Alcohol use: No  .  Drug use: No    Family History History reviewed. No pertinent family history.  Review of Systems 10 systems were reviewed and are negative except as noted specifically in the HPI.  Objective   Vital signs in last 24 hours: BP (!) 168/92   Pulse 95   Temp 98.6 F (37 C) (Rectal)   Resp 16   Ht 5\' 6"  (1.676 m)   Wt 47.6 kg (105 lb)   SpO2 99%   BMI 16.95 kg/m   Physical Exam General: NAD, A&O, resting, appropriate HEENT: [place]/AT, EOMI, MMM Pulmonary: Normal work of breathing Cardiovascular: HDS, adequate peripheral perfusion Abdomen: Soft, NTTP, nondistended, . GU: edematous glans with ventral penile erosion, 35fr coude in place  urien clear. Phimotic ring just proximal to distal penile edema. No pain on gentle palpation to the distal glans. No evidence of ischemia.  Extremities: warm and well perfused Neuro: Appropriate, no focal neurological deficits  Most Recent Labs: Lab Results  Component Value Date   WBC 10.3 06/09/2017   HGB 12.7 (L) 06/09/2017   HCT 37.8 (L) 06/09/2017   PLT 292 06/09/2017    Lab Results  Component Value Date   NA 140 06/09/2017   K 3.7 06/09/2017   CL 108 06/09/2017   CO2 25 06/09/2017   BUN 22 (H) 06/09/2017   CREATININE 1.04 06/09/2017   CALCIUM 9.0 06/09/2017   MG 1.8 10/01/2016    Lab Results  Component Value Date   INR 0.95 04/02/2017     IMAGING: No results found.  ------  Assessment:  83 y.o. male with hx of BPH and urinary retention managed with chronic indwelling foley. Also deaf blind and noted to have paraphimosis.   Catheter exchanged without difficulty. Recommend Qmontly exchanged at facility. Follow up with [facility] in next 3-4 weeks with care giver to discuss possible SPT.   In regard to the paraphimosis, given the amount of penile edema, reduction of the paraphimosis at bedside is likely no possible. Would require reduction in the operative room with possible dorsal slit. Given that the patient is DNR, has no pain or ischemia from the paraphimosis we opted to monitor it at this time.    Recommendations: - Keep foley catheter to drainage  - Culture urine  - Recommend close follow up and monitoring of paraphimosis    Thank you for this consult. Please contact the urology consult pager with any further questions/concerns.

## 2017-06-09 NOTE — Discharge Instructions (Signed)
Continue all home medications. Follow-up with urology as soon as possible. Call office today to make an appointment. Return to the ED for fevers, vomiting, no urine output, or any other Foley complications.

## 2017-06-09 NOTE — ED Provider Notes (Signed)
[name] DEPT Provider Note   CSN: [id number] Arrival date & time: 06/09/17  1101     History   Chief Complaint Chief Complaint  Patient presents with  . urinary catheter problem    HPI Alexander Rosales is a 83 y.o. male  with history of BPH with urinary retention with chronic urinary retention, blind, deaf, hypertension presents to the ED for evaluation of problems with chronic Foley. Level 5 caveat due to blindness and deafness. History of present illness is obtained from triage note which is also limited. Reportedly, staff at nursing facility has been unable to get Foley to drain appropriately. No other documented history or systemic symptoms.  HPI  Past Medical History:  Diagnosis Date  . Adult failure to thrive   . Anemia, unspecified   . Benign prostatic hyperplasia with lower urinary tract symptoms 10/01/2016  . Blind   . CAD (coronary artery disease)   . Chronic kidney disease   . Coronary atherosclerosis 04/10/2006   Qualifier: Diagnosis of  By: Hilma Favors  DO, Beth    . Deaf   . GERD (gastroesophageal reflux disease)   . Hyperkalemia   . Hyperlipidemia   . Hypertension   . Malignant neoplasm of kidney ([place])   . Mute   . Urinary tract infection, site not specified     Patient Active Problem List   Diagnosis Date Noted  . Chronic diarrhea 05/15/2017  . Unintentional weight loss of Rosales than 10 pounds in 90 days 02/19/2017  . Penis injury, subsequent encounter 12/17/2016  . Penile edema 11/06/2016  . Hypertensive kidney disease with CKD stage III ([place]) 10/02/2016  . CKD (chronic kidney disease), stage III ([place]) 10/02/2016  . BPH with urinary obstruction 10/02/2016  . Hyperkalemia 09/27/2016  . Back pain 08/20/2011  . Malignant neoplasm of kidney excluding renal pelvis ([place]) 02/07/2007  . Blindness of both eyes 04/10/2006  . Deaf mutism, acquired 04/10/2006  . Essential hypertension, benign 04/10/2006  . Coronary  atherosclerosis 04/10/2006  . GERD without esophagitis 04/10/2006    Past Surgical History:  Procedure Laterality Date  . CHOLECYSTECTOMY    . left partial nephrectomy         Home Medications    Prior to Admission medications   Medication Sig Start Date End Date Taking? Authorizing Provider  amLODipine-benazepril (LOTREL) 10-20 MG per capsule Take 1 capsule by mouth daily. 08/19/11  Yes Pedro Earls, MD  Cranberry 450 MG CAPS Take 1 tablet by mouth daily.   Yes [provider]  hydrALAZINE (APRESOLINE) 25 MG tablet Take 1 tablet (25 mg total) by mouth 2 (two) times daily. 08/19/11  Yes Pedro Earls, MD  loperamide (IMODIUM) 2 MG capsule Take 2 mg by mouth daily.   Yes [provider]  metoprolol (LOPRESSOR) 100 MG tablet Take 100 mg by mouth daily.   Yes [provider]  Nutritional Supplements (NUTRITIONAL SUPPLEMENT PO) Take by mouth. House 2.0 - [facility] -  Give  ml two times daily 120cc by mouth Three times daily   Yes [provider]  ranitidine (ZANTAC) 150 MG tablet Take 150 mg by mouth at bedtime.   Yes [provider]  Tamsulosin HCl (FLOMAX) 0.4 MG CAPS Take 1 capsule (0.4 mg total) by mouth daily. 09/16/11  Yes Pedro Earls, MD  acetaminophen (TYLENOL) 325 MG tablet Take 650 mg by mouth every 4 (four) hours as needed for mild pain.    [provider]    [facility]  History History reviewed. No pertinent family history.  Social History Social History   Tobacco Use  . Smoking status: Never Smoker  . Smokeless tobacco: Never Used  Substance Use Topics  . Alcohol use: No  . Drug use: No     Allergies   Aspirin   Review of Systems Review of Systems  Unable to perform ROS: Other (Deafness and blindness)  Genitourinary:       Foley problem     Physical Exam Updated Vital Signs BP (!) 168/92   Pulse 95   Temp 98.6 F (37 C) (Rectal)   Resp 16   Ht 5\' 6"  (1.676 m)   Wt 47.6 kg (105 lb)   SpO2 99%    BMI 16.95 kg/m   Physical Exam  Constitutional: He is oriented to person, place, and time. He appears well-developed and well-nourished. No distress.  NAD  HENT:  Head: Normocephalic and atraumatic.  Right Ear: External ear normal.  Left Ear: External ear normal.  Nose: Nose normal.  Eyes: Conjunctivae are normal.  Eyes remain closed  Neck: Normal range of motion. Neck supple.  Cardiovascular: Normal rate, regular rhythm, normal heart sounds and intact distal pulses.  No murmur heard. 2+ radial and DP pulses bilaterally No LE edema  Pulmonary/Chest: Effort normal and breath sounds normal. He has no wheezes.  Abdominal: Soft. There is no tenderness.  No signs of facial wincing or obvious manifestations of pain with suprapubic palpation. No peritoneal signs.  Genitourinary:  Genitourinary Comments: See picture. Circumferential penile edema. Urine appears to be oozing from sides of foley. Blood from meatus at 9 o'clock. No obvious signs of tenderness with palpation of penis tip, shaft, testicles  Musculoskeletal: Normal range of motion. He exhibits no deformity.  Neurological: He is alert and oriented to person, place, and time.  Skin: Skin is warm and dry. Capillary refill takes less than 2 seconds.  Moist, malodorous, patchy white skin to inferior aspect of penile shaft and around scrotum  Psychiatric: He has a normal mood and affect. His behavior is normal. Judgment and thought content normal.  Nursing note and vitals reviewed.        ED Treatments / Results  Labs (all labs ordered are listed, but only abnormal results are displayed) Labs Reviewed  CBC WITH DIFFERENTIAL/PLATELET - Abnormal; Notable for the following components:      Result Value   Hemoglobin 12.7 (*)    HCT 37.8 (*)    All other components within normal limits  URINALYSIS, ROUTINE W REFLEX MICROSCOPIC - Abnormal; Notable for the following components:   Color, Urine AMBER (*)    APPearance TURBID (*)     pH 9.0 (*)    Bilirubin Urine SMALL (*)    Protein, ur >=300 (*)    Leukocytes, UA LARGE (*)    Bacteria, UA MANY (*)    All other components within normal limits  COMPREHENSIVE METABOLIC PANEL - Abnormal; Notable for the following components:   BUN 22 (*)    ALT 13 (*)    All other components within normal limits  URINE CULTURE  CULTURE, BLOOD (ROUTINE X 2)  CULTURE, BLOOD (ROUTINE X 2)  I-STAT CG4 LACTIC ACID, ED  I-STAT CG4 LACTIC ACID, ED    EKG  EKG Interpretation None       Radiology No results found.  Procedures Procedures (including critical care time)  Medications Ordered in ED Medications  HYDROmorphone (DILAUDID) injection 0.5 mg (0.5 mg Intravenous Given 06/09/17 1604)  Initial Impression / Assessment and Plan / ED Course  I have reviewed the triage vital signs and the nursing notes.  Pertinent labs & imaging results that were available during my care of the patient were reviewed by me and considered in my medical decision making (see chart for details).    83 year old with history of BPH, urinary retention, chronic Foley, deaf, blind sent to ED for problems with Foley. On exam, he is in no distress. He is not showing any obvious signs of pain with palpation of the GU area. He is a febrile, rectally. His significant penile edema and anatomy. Attempted to aspirate balloon and obtained 6 mL of urine tinged fluid. Last documented Foley change was in October in the ED by urology. He seems to be having some urine dribbling from around the Foley. The choice was made to leave Foley in place, obtain lab work and consult urology to avoid further trauma to penis.  1534: Spoke to Urology who will send resident to evaluate patient in ED. No leukocytosis on CBC, remaining lab work and U/A pending.   Final Clinical Impressions(s) / ED Diagnoses    the patient shared with supervising physician. We will hand off patient to oncoming ED PA who will follow-up lab work,  urology consult and determine disposition. Anticipate discharge if workup benign. Consider antifungal for yeast and/or antibiotics.   1617: Urology resident able to change Foley. He recommends discharge with close follow-up in clinic, no antibiotics for UTI. Chronically contaminated. Lab works are unremarkable. No leukocytosis. Lactic acid normal. No elevation in creatinine. We'll discharge at this time with urology follow-up. Final diagnoses:  Complication of Foley catheter, initial encounter [facility])  Penile edema    ED Discharge Orders    None       Kinnie Feil, PA-C 06/09/17 1541    Kinnie Feil, PA-C 06/09/17 1618    Daleen Bo, MD 06/09/17 [phone]

## 2017-06-09 NOTE — ED Triage Notes (Signed)
Pt from [facility] SN[facility] for "issues with catheter not draining right." per EMS. Care team at [facility] was unable to give any more information. Pt is blind and deaf.

## 2017-06-09 NOTE — ED Provider Notes (Signed)
  Face-to-face evaluation   History: Patient presents for evaluation of "urinary catheter not draining right."  He is unable to give any history.  Physical exam: Elderly, debilitated.  Abdomen soft without palpable mass and is nontender to palpation.  There is a Foley catheter in the penis.  The end of the penis is markedly swollen, and it is difficult to tell if there is a foreskin that is swollen, or the glans penis is swollen.  There is mild redness of the distal penis, and apparent urethral hypospadias.  The penis is nontender to palpation.  There is no swelling, redness, or drainage above or below the penis.  Scrotum and scrotal contents are normal.   Medical screening examination/treatment/procedure(s) were conducted as a shared visit with non-physician practitioner(s) and myself.  I personally evaluated the patient during the encounter    Daleen Bo, MD 06/09/17 1731

## 2017-06-09 NOTE — ED Notes (Signed)
[facility] called for transportation back to facility 

## 2017-06-10 LAB — URINE CULTURE

## 2017-06-11 ENCOUNTER — Telehealth: Payer: Self-pay

## 2017-06-11 NOTE — Telephone Encounter (Signed)
Possible re-admission to facility. This is a patient you were seeing at [facility] F/U is needed if patient was re-admitted to facility upon discharge. Hospital discharge from [facility] on 06/09/2017

## 2017-06-14 LAB — CULTURE, BLOOD (ROUTINE X 2)
CULTURE: NO GROWTH
SPECIAL REQUESTS: ADEQUATE

## 2017-06-24 ENCOUNTER — Emergency Department (HOSPITAL_COMMUNITY)
Admission: EM | Admit: 2017-06-24 | Discharge: 2017-06-25 | Disposition: A | Discharge status: Skilled Nursing Facility | Payer: Medicare Other | Attending: Emergency Medicine | Admitting: Emergency Medicine

## 2017-06-24 ENCOUNTER — Other Ambulatory Visit: Payer: Self-pay

## 2017-06-24 ENCOUNTER — Encounter (HOSPITAL_COMMUNITY): Payer: Self-pay | Admitting: Emergency Medicine

## 2017-06-24 DIAGNOSIS — I2510 Atherosclerotic heart disease of native coronary artery without angina pectoris: Secondary | ICD-10-CM | POA: Insufficient documentation

## 2017-06-24 DIAGNOSIS — R4182 Altered mental status, unspecified: Secondary | ICD-10-CM | POA: Diagnosis not present

## 2017-06-24 DIAGNOSIS — H913 Deaf nonspeaking, not elsewhere classified: Secondary | ICD-10-CM | POA: Diagnosis not present

## 2017-06-24 DIAGNOSIS — T839XXA Unspecified complication of genitourinary prosthetic device, implant and graft, initial encounter: Secondary | ICD-10-CM

## 2017-06-24 DIAGNOSIS — T83091A Other mechanical complication of indwelling urethral catheter, initial encounter: Secondary | ICD-10-CM | POA: Diagnosis not present

## 2017-06-24 DIAGNOSIS — H547 Unspecified visual loss: Secondary | ICD-10-CM | POA: Insufficient documentation

## 2017-06-24 DIAGNOSIS — T83098A Other mechanical complication of other indwelling urethral catheter, initial encounter: Secondary | ICD-10-CM | POA: Insufficient documentation

## 2017-06-24 DIAGNOSIS — N183 Chronic kidney disease, stage 3 (moderate): Secondary | ICD-10-CM | POA: Insufficient documentation

## 2017-06-24 DIAGNOSIS — I129 Hypertensive chronic kidney disease with stage 1 through stage 4 chronic kidney disease, or unspecified chronic kidney disease: Secondary | ICD-10-CM | POA: Diagnosis not present

## 2017-06-24 DIAGNOSIS — Z79899 Other long term (current) drug therapy: Secondary | ICD-10-CM | POA: Diagnosis not present

## 2017-06-24 DIAGNOSIS — Z466 Encounter for fitting and adjustment of urinary device: Secondary | ICD-10-CM | POA: Diagnosis not present

## 2017-06-24 DIAGNOSIS — Y828 Other medical devices associated with adverse incidents: Secondary | ICD-10-CM | POA: Insufficient documentation

## 2017-06-24 HISTORY — DX: Acute kidney failure, unspecified: N17.9

## 2017-06-24 NOTE — ED Triage Notes (Signed)
Pt brought in by [facility] from [facility] for c/o blocked catheter  Staff at facility attempted to irrigate catheter  Pt is blind and deaf

## 2017-06-25 DIAGNOSIS — T83091A Other mechanical complication of indwelling urethral catheter, initial encounter: Secondary | ICD-10-CM | POA: Diagnosis not present

## 2017-06-25 DIAGNOSIS — R100 Acute abdomen: Secondary | ICD-10-CM | POA: Diagnosis not present

## 2017-06-25 NOTE — ED Provider Notes (Signed)
[facility] DEPT Provider Note   CSN: [id number] Arrival date & time: 06/24/17  2206     History   Chief Complaint Chief Complaint  Patient presents with  . catheter blocked    HPI Alexander Rosales is a 84 y.o. male.  HPI Patient presents to the emergency department with urinary catheter issues.  Patient is unable to give any history due to the fact that he is deaf and blind.  Patient has chronic indwelling Foley catheter.  The nursing home staff states that the catheter was not draining appropriately. Past Medical History:  Diagnosis Date  . Acute kidney failure ([place])   . Adult failure to thrive   . Anemia, unspecified   . Benign prostatic hyperplasia with lower urinary tract symptoms 10/01/2016  . Blind   . CAD (coronary artery disease)   . Chronic kidney disease   . Coronary atherosclerosis 04/10/2006   Qualifier: Diagnosis of  By: Hilma Favors  DO, Beth    . Deaf   . GERD (gastroesophageal reflux disease)   . Hyperkalemia   . Hyperlipidemia   . Hypertension   . Malignant neoplasm of kidney ([place])   . Mute   . Urinary tract infection, site not specified     Patient Active Problem List   Diagnosis Date Noted  . Failure to thrive in adult 06/09/2017  . Chronic diarrhea 05/15/2017  . Unintentional weight loss of more than 10 pounds in 90 days 02/19/2017  . Penis injury, subsequent encounter 12/17/2016  . Penile edema 11/06/2016  . Hypertensive kidney disease with CKD stage III ([place]) 10/02/2016  . CKD (chronic kidney disease), stage III ([place]) 10/02/2016  . BPH with urinary obstruction 10/02/2016  . Hyperkalemia 09/27/2016  . Back pain 08/20/2011  . Malignant neoplasm of kidney excluding renal pelvis ([place]) 02/07/2007  . Blindness of both eyes 04/10/2006  . Deaf mutism, acquired 04/10/2006  . Essential hypertension, benign 04/10/2006  . Coronary atherosclerosis 04/10/2006  . GERD without esophagitis 04/10/2006    Past Surgical History:    Procedure Laterality Date  . CHOLECYSTECTOMY    . left partial nephrectomy         Home Medications    Prior to Admission medications   Medication Sig Start Date End Date Taking? Authorizing Provider  acetaminophen (TYLENOL) 325 MG tablet Take 650 mg by mouth every 4 (four) hours as needed for mild pain.   Yes [provider]  amLODipine-benazepril (LOTREL) 10-20 MG per capsule Take 1 capsule by mouth daily. 08/19/11  Yes Pedro Earls, MD  Cranberry 450 MG CAPS Take 1 tablet by mouth daily.   Yes [provider]  hydrALAZINE (APRESOLINE) 25 MG tablet Take 1 tablet (25 mg total) by mouth 2 (two) times daily. 08/19/11  Yes Pedro Earls, MD  loperamide (IMODIUM) 2 MG capsule Take 2 mg by mouth daily.   Yes [provider]  metoprolol (LOPRESSOR) 100 MG tablet Take 100 mg by mouth daily.   Yes [provider]  Nutritional Supplements (NUTRITIONAL SUPPLEMENT PO) Take by mouth. House 2.0 - [facility] -  Give  ml two times daily 120cc by mouth Three times daily   Yes [provider]  ranitidine (ZANTAC) 150 MG tablet Take 150 mg by mouth at bedtime.   Yes [provider]  Tamsulosin HCl (FLOMAX) 0.4 MG CAPS Take 1 capsule (0.4 mg total) by mouth daily. 09/16/11  Yes Pedro Earls, MD    Family History History reviewed. No pertinent family  history.  Social History Social History   Tobacco Use  . Smoking status: Never Smoker  . Smokeless tobacco: Never Used  Substance Use Topics  . Alcohol use: No  . Drug use: No     Allergies   Aspirin   Review of Systems Review of Systems  Level 5 caveat applies due to the patient being unable to communicate with [place]. Physical Exam Updated Vital Signs BP (!) 170/84 (BP Location: Left Arm)   Pulse 70   Temp 97.8 F (36.6 C) (Oral)   Resp 20   SpO2 100%   Physical Exam  Constitutional: He is oriented to person, place, and time. He appears well-developed and well-nourished. No  distress.  HENT:  Head: Normocephalic and atraumatic.  Eyes: Pupils are equal, round, and reactive to light.  Pulmonary/Chest: Effort normal.  Neurological: He is alert and oriented to person, place, and time.  Skin: Skin is warm and dry.  Psychiatric: He has a normal mood and affect.  Nursing note and vitals reviewed. Patient has what appears to be a hypospadias along with swelling around the distal portion of the penis.  This is been noticed on previous exams   ED Treatments / Results  Labs (all labs ordered are listed, but only abnormal results are displayed) Labs Reviewed - No data to display  EKG  EKG Interpretation None       Radiology No results found.  Procedures Procedures (including critical care time)  Medications Ordered in ED Medications - No data to display   Initial Impression / Assessment and Plan / ED Course  I have reviewed the triage vital signs and the nursing notes.  Pertinent labs & imaging results that were available during my care of the patient were reviewed by me and considered in my medical decision making (see chart for details).     We replaced the patient's Foley catheter.  We will have them follow-up with his urologist and primary doctor.  Final Clinical Impressions(s) / ED Diagnoses   Final diagnoses:  None    ED Discharge Orders    None       Dalia Heading, PA-C 06/25/17 0233    Shanon Rosser, MD 06/25/17 [phone]

## 2017-06-25 NOTE — Discharge Instructions (Signed)
Return here as needed. Follow up with his doctor. °

## 2017-07-14 ENCOUNTER — Encounter: Payer: Self-pay | Admitting: Internal Medicine

## 2017-07-14 ENCOUNTER — Non-Acute Institutional Stay (SKILLED_NURSING_FACILITY): Payer: Medicare Other | Admitting: Internal Medicine

## 2017-07-14 DIAGNOSIS — I10 Essential (primary) hypertension: Secondary | ICD-10-CM

## 2017-07-14 DIAGNOSIS — R627 Adult failure to thrive: Secondary | ICD-10-CM

## 2017-07-14 DIAGNOSIS — R634 Abnormal weight loss: Secondary | ICD-10-CM | POA: Diagnosis not present

## 2017-07-14 DIAGNOSIS — N183 Chronic kidney disease, stage 3 unspecified: Secondary | ICD-10-CM

## 2017-07-14 DIAGNOSIS — Z85528 Personal history of other malignant neoplasm of kidney: Secondary | ICD-10-CM

## 2017-07-14 DIAGNOSIS — N138 Other obstructive and reflux uropathy: Secondary | ICD-10-CM

## 2017-07-14 DIAGNOSIS — I129 Hypertensive chronic kidney disease with stage 1 through stage 4 chronic kidney disease, or unspecified chronic kidney disease: Secondary | ICD-10-CM

## 2017-07-14 DIAGNOSIS — N401 Enlarged prostate with lower urinary tract symptoms: Secondary | ICD-10-CM

## 2017-07-14 NOTE — Progress Notes (Signed)
Patient ID: Alexander Rosales, male   DOB: 02-11-1930, 84 y.o.   MRN: [id number]   Location:  [facility] Room Number: 409 A Place of Service:  SNF (31) Provider:  DR Zavannah Deblois Gwendalyn Ege, DO  Patient Care Team: Gildardo Cranker, DO as PCP - General (Internal Medicine) [facility] [place] [facility]  Extended Emergency Contact Information Primary Emergency Contact: Haltiwanger,[name] Address: [address]          [place], [place] [address] [name] of [place] Phone: [phone] Relation: Other Secondary Emergency Contact: [name] Address: [address]          [place], [place] [address] [place] of [place] Phone: [phone] Relation: Niece  Code Status:  DNR Goals of care: Advanced Directive information Advanced Directives 07/14/2017  Does Patient Have a Medical Advance Directive? Yes  Type of Advance Directive Out of facility DNR (pink MOST or yellow form)  Does patient want to make changes to medical advance directive? No - Patient declined  Would patient like information on creating a medical advance directive? -  Pre-existing out of facility DNR order (yellow form or pink MOST form) Yellow form placed in chart (order not valid for inpatient use);Pink MOST form placed in chart (order not valid for inpatient use)     Chief Complaint  Patient presents with  . Medical Management of Chronic Issues    [facility]     HPI:  Pt is a 84 y.o. male seen today for medical management of chronic diseases. He is a poor historian due to mute states. Hx obtained from chart . He was taken to the ED x 2 for foley cath issues. Nursing reports poor appetite and states pt picks at his food.  BPH with urine retention - chronic foley cath. He takes flomax 0.4 mg daily   CKD stage 3/hx malignant neoplasm of left kidney - s/p partial  left nephrectomy; Cr 1.04  FTT - albumin 3.5; weight down 3 lbs. He completed remeron tx    HTN - BP stable on lotrel 10/20 mg daily; apresoline 25 mg twice daily; lopressor 100 mg daily.   CAD - asymptomatic; takes lopressor 100 mg daily   GERD -stable on zantac 150 mg daily   Chronic diarrhea - controlled on imodium 2 mg daily      Past Medical History:  Diagnosis Date  . Acute kidney failure ([place])   . Adult failure to thrive   . Anemia, unspecified   . Benign prostatic hyperplasia with lower urinary tract symptoms 10/01/2016  . Blind   . CAD (coronary artery disease)   . Chronic kidney disease   . Coronary atherosclerosis 04/10/2006   Qualifier: Diagnosis of  By: Hilma Favors  DO, Beth    . Deaf   . GERD (gastroesophageal reflux disease)   . Hyperkalemia   . Hyperlipidemia   . Hypertension   . Malignant neoplasm of kidney ([place])   . Mute   . Urinary tract infection, site not specified    Past Surgical History:  Procedure Laterality Date  . CHOLECYSTECTOMY    . left partial nephrectomy      Allergies  Allergen Reactions  . Aspirin Other (See Comments)    Reaction:  Unknown     Outpatient Encounter Medications as of 07/14/2017  Medication Sig  . acetaminophen (TYLENOL) 325 MG tablet Take 650 mg by mouth every 4 (four) hours as needed for mild pain.  Marland Kitchen amLODipine-benazepril (LOTREL)  10-20 MG per capsule Take 1 capsule by mouth daily.  . Cranberry 450 MG CAPS Take 1 tablet by mouth daily.  . hydrALAZINE (APRESOLINE) 25 MG tablet Take 1 tablet (25 mg total) by mouth 2 (two) times daily.  Marland Kitchen loperamide (IMODIUM) 2 MG capsule Take 2 mg by mouth daily.  . metoprolol (LOPRESSOR) 100 MG tablet Take 100 mg by mouth daily.  . Nutritional Supplements (NUTRITIONAL SUPPLEMENT PO) Take by mouth. House 2.0 - Med Pass -  Give 120 cc by mouth three times daily  . ranitidine (ZANTAC) 150 MG tablet Take 150 mg by mouth at bedtime.  . Tamsulosin HCl (FLOMAX) 0.4 MG CAPS Take 1 capsule (0.4 mg total) by mouth daily.   No facility-administered encounter medications on file as  of 07/14/2017.     Review of Systems  Unable to perform ROS: Dementia    Immunization History  Administered Date(s) Administered  . Influenza Split 08/19/2011  . PPD Test 10/17/2016  . Pneumococcal-Unspecified 12/25/2016   Pertinent  Health Maintenance Due  Topic Date Due  . INFLUENZA VACCINE  01/23/2018 (Originally 01/22/2017)  . PNA vac Low Risk Adult (2 of 2 - PCV13) 12/25/2017   Fall Risk  12/23/2016  Falls in the past year? No   Functional Status Survey:    Vitals:   07/14/17 1113  BP: 130/68  Pulse: (!) 108  Resp: 18  Temp: 98.8 F (37.1 C)  SpO2: 99%  Weight: 102 lb 12.8 oz (46.6 kg)  Height: 5\' 6"  (1.676 m)   Body mass index is 16.59 kg/m. Physical Exam  Constitutional: He appears well-developed.  Frail appearing sitting up in bed in NAD  HENT:  Mouth/Throat: Oropharynx is clear and moist.  MMM; no oral thrush  Eyes: Pupils are equal, round, and reactive to light. No scleral icterus.  Neck: Neck supple. Carotid bruit is not present. No thyromegaly present.  Cardiovascular: Normal rate, regular rhythm and intact distal pulses. Frequent extrasystoles are present. Exam reveals no gallop and no friction rub.  Murmur heard.  Systolic murmur is present with a grade of 1/6. No distal LE edema. No calf TTP  Pulmonary/Chest: Effort normal and breath sounds normal. He has no wheezes. He has no rales. He exhibits no tenderness.  Abdominal: Soft. Normal appearance and bowel sounds are normal. He exhibits no distension, no abdominal bruit, no pulsatile midline mass and no mass. There is no hepatomegaly. There is no tenderness. There is no rigidity, no rebound and no guarding. No hernia.  Genitourinary:  Genitourinary Comments: Foley intact and DTG clear yellow urine  Musculoskeletal: He exhibits edema.  Lymphadenopathy:    He has no cervical adenopathy.  Neurological: He is alert.  Skin: Skin is warm and dry. No rash noted.  Psychiatric: He has a normal mood and  affect. His behavior is normal.    Labs reviewed: Recent Labs    09/30/16 1500 10/01/16 0655  12/18/16 0115  04/02/17 1315 06/07/17 06/09/17 1445  NA 133* 131*   < > 139   < > 135 145 140  K 4.2 3.8   < > 3.6   < > 4.0 4.5 3.7  CL 102 101  --  104  --  103  --  108  CO2 23 23  --   --   --  22  --  25  GLUCOSE 168* 99  --  92  --  108*  --  93  BUN 17 14   < > 17   < >  20 20 22*  CREATININE 1.24 1.17   < > 1.00   < > 1.10 1.0 1.04  CALCIUM 8.5* 8.0*  --   --   --  8.9  --  9.0  MG 1.3* 1.8  --   --   --   --   --   --    < > = values in this interval not displayed.   Recent Labs    09/30/16 1500  04/02/17 1315 06/07/17 06/09/17 1445  AST 33   < > 14* 10* 18  ALT 21   < > 10* 8* 13*  ALKPHOS 45   < > 66 61 67  BILITOT 0.6  --  1.5*  --  0.6  PROT 6.1*  --  7.4  --  7.2  ALBUMIN 2.8*  --  3.8  --  3.5   < > = values in this interval not displayed.   Recent Labs    09/30/16 0829  02/20/17 04/02/17 1315 06/09/17 1445  WBC 12.2*   < > 8.2 13.0* 10.3  NEUTROABS  --    < > 5 9.9* 5.9  HGB 10.5*   < > 13.7 13.8 12.7*  HCT 30.4*   < > 42 40.9 37.8*  MCV 88.4  --   --  84.7 83.6  PLT 156   < > 253 311 292   < > = values in this interval not displayed.   Lab Results  Component Value Date   TSH 2.05 02/20/2017   Lab Results  Component Value Date   HGBA1C  03/07/2009    6.1 (NOTE) The ADA recommends the following therapeutic goal for glycemic control related to Hgb A1c measurement: Goal of therapy: <6.5 Hgb A1c  Reference: American Diabetes Association: Clinical Practice Recommendations 2010, Diabetes Care, 2010, 33: (Suppl  1).   Lab Results  Component Value Date   CHOL 118 06/07/2017   HDL 40 06/07/2017   LDLCALC 59 06/07/2017   TRIG 95 06/07/2017   CHOLHDL 3.7 08/19/2011    Significant Diagnostic Results in last 30 days:  No results found.  Assessment/Plan   ICD-10-CM   1. Failure to thrive in adult R62.7   2. Essential hypertension, benign I10   3.  BPH with urinary obstruction N40.1    N13.8   4. Hypertensive kidney disease with CKD stage III (HCC) I12.9    N18.3   5. Unintentional weight loss of more than 10 pounds in 90 days R63.4   6. Personal history of renal cancer Z85.528     cont current meds as ordered  Cont nutritional supplement as ordered  PT/OT/ST as indicated  Foley cath care as indicated  OPT[facility] NP to follow  Will follow  Labs/tests ordered: none    Clare Fennimore S. Perlie Gold  [facility] [address] [place], [place] [address] [phone] Cell (Monday-Friday 8 AM - 5 PM) [phone] After 5 PM and follow prompts

## 2017-08-06 ENCOUNTER — Emergency Department (HOSPITAL_COMMUNITY)
Admission: EM | Admit: 2017-08-06 | Discharge: 2017-08-06 | Disposition: A | Discharge status: Home / Self Care | Payer: Medicare Other | Attending: Emergency Medicine | Admitting: Emergency Medicine

## 2017-08-06 ENCOUNTER — Other Ambulatory Visit: Payer: Self-pay

## 2017-08-06 DIAGNOSIS — Z9889 Other specified postprocedural states: Secondary | ICD-10-CM | POA: Diagnosis not present

## 2017-08-06 DIAGNOSIS — N183 Chronic kidney disease, stage 3 (moderate): Secondary | ICD-10-CM | POA: Insufficient documentation

## 2017-08-06 DIAGNOSIS — I2510 Atherosclerotic heart disease of native coronary artery without angina pectoris: Secondary | ICD-10-CM | POA: Diagnosis not present

## 2017-08-06 DIAGNOSIS — Y731 Therapeutic (nonsurgical) and rehabilitative gastroenterology and urology devices associated with adverse incidents: Secondary | ICD-10-CM | POA: Diagnosis not present

## 2017-08-06 DIAGNOSIS — I129 Hypertensive chronic kidney disease with stage 1 through stage 4 chronic kidney disease, or unspecified chronic kidney disease: Secondary | ICD-10-CM | POA: Diagnosis not present

## 2017-08-06 DIAGNOSIS — T83028A Displacement of other indwelling urethral catheter, initial encounter: Secondary | ICD-10-CM | POA: Diagnosis present

## 2017-08-06 DIAGNOSIS — Z79899 Other long term (current) drug therapy: Secondary | ICD-10-CM | POA: Insufficient documentation

## 2017-08-06 DIAGNOSIS — T83498A Other mechanical complication of other prosthetic devices, implants and grafts of genital tract, initial encounter: Secondary | ICD-10-CM | POA: Diagnosis not present

## 2017-08-06 DIAGNOSIS — N390 Urinary tract infection, site not specified: Secondary | ICD-10-CM | POA: Insufficient documentation

## 2017-08-06 DIAGNOSIS — T83198A Other mechanical complication of other urinary devices and implants, initial encounter: Secondary | ICD-10-CM | POA: Diagnosis not present

## 2017-08-06 DIAGNOSIS — Z978 Presence of other specified devices: Secondary | ICD-10-CM

## 2017-08-06 LAB — URINALYSIS, ROUTINE W REFLEX MICROSCOPIC
Bilirubin Urine: NEGATIVE
GLUCOSE, UA: NEGATIVE mg/dL
KETONES UR: NEGATIVE mg/dL
NITRITE: NEGATIVE
PROTEIN: 30 mg/dL — AB
Specific Gravity, Urine: 1.016 (ref 1.005–1.030)
Squamous Epithelial / LPF: NONE SEEN
pH: 6 (ref 5.0–8.0)

## 2017-08-06 MED ORDER — CEPHALEXIN 500 MG PO CAPS: 500.0000 mg | ORAL_CAPSULE | Freq: Four times a day (QID) | ORAL | 0 refills | Status: DC

## 2017-08-06 NOTE — ED Triage Notes (Signed)
Per EMS, patient comes from [facility]. Urinary catheter was displaced, not present on assessment. Patient is blind and deaf. Nonverbal.

## 2017-08-06 NOTE — ED Notes (Signed)
Bed: RESB Expected date:  Expected time:  Means of arrival:  Comments: EMS/pulled out foley cath.

## 2017-08-06 NOTE — ED Provider Notes (Signed)
[facility] DEPT Provider Note   CSN: [id number] Arrival date & time: 08/06/17  1015     History   Chief Complaint Chief Complaint  Patient presents with  . Displaced Catheter    HPI Alexander Rosales is a 84 y.o. male.  Level 5 caveat for blindness and deafness.  Patient is at start him out at skilled nursing facility.  His Foley catheter was pulled out.  Facility wants a new Foley catheter placed.  Nursing home reports no new changes in behavior, fever, or any other concerns.      Past Medical History:  Diagnosis Date  . Acute kidney failure ([place])   . Adult failure to thrive   . Anemia, unspecified   . Benign prostatic hyperplasia with lower urinary tract symptoms 10/01/2016  . Blind   . CAD (coronary artery disease)   . Chronic kidney disease   . Coronary atherosclerosis 04/10/2006   Qualifier: Diagnosis of  By: Hilma Favors  DO, Beth    . Deaf   . GERD (gastroesophageal reflux disease)   . Hyperkalemia   . Hyperlipidemia   . Hypertension   . Malignant neoplasm of kidney ([place])   . Mute   . Urinary tract infection, site not specified     Patient Active Problem List   Diagnosis Date Noted  . Failure to thrive in adult 06/09/2017  . Chronic diarrhea 05/15/2017  . Unintentional weight loss of more than 10 pounds in 90 days 02/19/2017  . Penis injury, subsequent encounter 12/17/2016  . Penile edema 11/06/2016  . Hypertensive kidney disease with CKD stage III ([place]) 10/02/2016  . CKD (chronic kidney disease), stage III ([place]) 10/02/2016  . BPH with urinary obstruction 10/02/2016  . Hyperkalemia 09/27/2016  . Back pain 08/20/2011  . Malignant neoplasm of kidney excluding renal pelvis ([facility]) 02/07/2007  . Blindness of both eyes 04/10/2006  . Deaf mutism, acquired 04/10/2006  . Essential hypertension, benign 04/10/2006  . Coronary atherosclerosis 04/10/2006  . GERD without esophagitis 04/10/2006    Past Surgical History:  Procedure  Laterality Date  . CHOLECYSTECTOMY    . left partial nephrectomy         Home Medications    Prior to Admission medications   Medication Sig Start Date End Date Taking? Authorizing Provider  acetaminophen (TYLENOL) 325 MG tablet Take 650 mg by mouth every 4 (four) hours as needed for mild pain.   Yes [provider]  amLODipine-benazepril (LOTREL) 10-20 MG per capsule Take 1 capsule by mouth daily. 08/19/11  Yes Pedro Earls, MD  Cranberry 450 MG CAPS Take 1 tablet by mouth daily.   Yes [provider]  hydrALAZINE (APRESOLINE) 25 MG tablet Take 1 tablet (25 mg total) by mouth 2 (two) times daily. 08/19/11  Yes Pedro Earls, MD  loperamide (IMODIUM A-D) 2 MG tablet Take 2 mg by mouth daily as needed for diarrhea or loose stools.   Yes [provider]  metoprolol (LOPRESSOR) 100 MG tablet Take 100 mg by mouth daily.   Yes [provider]  Nutritional Supplements (NUTRITIONAL SUPPLEMENT PO) Take by mouth. House 2.0 - [facility] -  Give 120 cc by mouth three times daily   Yes [provider]  ranitidine (ZANTAC) 150 MG tablet Take 150 mg by mouth at bedtime.   Yes [provider]  Tamsulosin HCl (FLOMAX) 0.4 MG CAPS Take 1 capsule (0.4 mg total) by mouth daily. 09/16/11  Yes Pedro Earls, MD  cephALEXin (KEFLEX) 500  MG capsule Take 1 capsule (500 mg total) by mouth 4 (four) times daily. 08/06/17   Nat Christen, MD    Family History No family history on file.  Social History Social History   Tobacco Use  . Smoking status: Never Smoker  . Smokeless tobacco: Never Used  Substance Use Topics  . Alcohol use: No  . Drug use: No     Allergies   Aspirin   Review of Systems Review of Systems  Unable to perform ROS: Other (deaf and blind)     Physical Exam Updated Vital Signs BP (!) 153/68 (BP Location: Left Arm)   Pulse 70   Temp 97.9 F (36.6 C) (Oral)   Resp 16   Ht 5\' 6"  (1.676 m)   Wt 46.3 kg (102 lb)   SpO2 100%    BMI 16.46 kg/m   Physical Exam  Constitutional: He is oriented to person, place, and time. He appears well-developed and well-nourished.  HENT:  Head: Normocephalic and atraumatic.  Eyes: Conjunctivae are normal.  Neck: Neck supple.  Cardiovascular: Normal rate and regular rhythm.  Pulmonary/Chest: Effort normal and breath sounds normal.  Abdominal: Soft. Bowel sounds are normal.  Genitourinary:  Genitourinary Comments: Genitourinary exam appears normal.  Musculoskeletal: Normal range of motion.  Neurological: He is alert and oriented to person, place, and time.  Skin: Skin is warm and dry.  Psychiatric: He has a normal mood and affect. His behavior is normal.  Nursing note and vitals reviewed.    ED Treatments / Results  Labs (all labs ordered are listed, but only abnormal results are displayed) Labs Reviewed  URINALYSIS, ROUTINE W REFLEX MICROSCOPIC - Abnormal; Notable for the following components:      Result Value   APPearance CLOUDY (*)    Hgb urine dipstick SMALL (*)    Protein, ur 30 (*)    Leukocytes, UA LARGE (*)    Bacteria, UA RARE (*)    All other components within normal limits  URINE CULTURE    EKG  EKG Interpretation None       Radiology No results found.  Procedures Procedures (including critical care time)  Medications Ordered in ED Medications - No data to display   Initial Impression / Assessment and Plan / ED Course  I have reviewed the triage vital signs and the nursing notes.  Pertinent labs & imaging results that were available during my care of the patient were reviewed by me and considered in my medical decision making (see chart for details).     Patient is in no acute distress.  His Foley catheter was replaced by the RN.  Urinalysis shows evidence of infection.  Urine culture.  Will Rx Keflex 500 mg.  Final Clinical Impressions(s) / ED Diagnoses   Final diagnoses:  Status post insertion of Foley catheter  Urinary tract  infection without hematuria, site unspecified    ED Discharge Orders        Ordered    cephALEXin (KEFLEX) 500 MG capsule  4 times daily     08/06/17 1214       Nat Christen, MD 08/06/17 1219

## 2017-08-06 NOTE — Discharge Instructions (Signed)
Foley catheter was replaced.  There is evidence of a urinary tract infection.  Will start an antibiotic.  Urine culture pending.  Follow-up with local urology group.  Phone number given.

## 2017-08-06 NOTE — ED Notes (Signed)
Called [facility] per Dr. Lacinda Axon, spoke with Abigail Miyamoto, RN. Patient just needs a new indwelling catheter, no prior issues with insertion per RN.

## 2017-08-06 NOTE — ED Notes (Signed)
[facility] called for transport; report given to [facility] RN

## 2017-08-08 LAB — URINE CULTURE

## 2017-09-04 ENCOUNTER — Non-Acute Institutional Stay (SKILLED_NURSING_FACILITY): Payer: Medicare Other | Admitting: Internal Medicine

## 2017-09-04 ENCOUNTER — Encounter: Payer: Self-pay | Admitting: Internal Medicine

## 2017-09-04 DIAGNOSIS — I10 Essential (primary) hypertension: Secondary | ICD-10-CM | POA: Diagnosis not present

## 2017-09-04 DIAGNOSIS — Z85528 Personal history of other malignant neoplasm of kidney: Secondary | ICD-10-CM

## 2017-09-04 DIAGNOSIS — Z9289 Personal history of other medical treatment: Secondary | ICD-10-CM | POA: Diagnosis not present

## 2017-09-04 DIAGNOSIS — N183 Chronic kidney disease, stage 3 unspecified: Secondary | ICD-10-CM

## 2017-09-04 DIAGNOSIS — I25119 Atherosclerotic heart disease of native coronary artery with unspecified angina pectoris: Secondary | ICD-10-CM | POA: Diagnosis not present

## 2017-09-04 DIAGNOSIS — Z960 Presence of urogenital implants: Secondary | ICD-10-CM

## 2017-09-04 DIAGNOSIS — N138 Other obstructive and reflux uropathy: Secondary | ICD-10-CM

## 2017-09-04 DIAGNOSIS — Z978 Presence of other specified devices: Secondary | ICD-10-CM

## 2017-09-04 DIAGNOSIS — N401 Enlarged prostate with lower urinary tract symptoms: Secondary | ICD-10-CM | POA: Diagnosis not present

## 2017-09-04 NOTE — Progress Notes (Signed)
Patient ID: GARET HOOTON, male   DOB: Sep 24, 1929, 84 y.o.   MRN: [id number]  Location:  [facility] Room Number: 228 A Place of Service:  SNF (31) Provider:  Bancroft, Kingston, DO  Patient Care Team: Gildardo Cranker, DO as PCP - General (Internal Medicine) Center, [place] ([place])  Extended Emergency Contact Information Primary Emergency Contact: Wojcicki,[name] Address: [address]          [place], [place] [address] [place] Phone: [phone] Relation: Other Secondary Emergency Contact: [name] Address: [address]          [place], [place] [address] [place] Phone: [phone] Relation: Niece  Code Status:  DNR Goals of care: Advanced Directive information Advanced Directives 09/04/2017  Does Patient Have a Medical Advance Directive? Yes  Type of Advance Directive Out of facility DNR (pink MOST or yellow form)  Does patient want to make changes to medical advance directive? No - Patient declined  Would patient like information on creating a medical advance directive? -  Pre-existing out of facility DNR order (yellow form or pink MOST form) Yellow form placed in chart (order not valid for inpatient use);Pink MOST form placed in chart (order not valid for inpatient use)     Chief Complaint  Patient presents with  . Medical Management of Chronic Issues    [facility]    HPI:  Pt is a 84 y.o. male seen today for medical management of chronic diseases.  He is a poor historian due to nonverbal state. He is blind/deaf/mute. Hx obtained from chart. No nursing issues. No falls. Appetite ok and sleeps well. He went to the ED in 07/2017 to have foley replaced when it fell out. Urine cx grew multiple spp.  BPH with urine retention - chronic foley cath. Stable on flomax 0.4 mg daily   CKD stage 3/hx malignant neoplasm of left kidney - s/p partial  left nephrectomy; Cr 1.04  FTT -  albumin 3.5; weight stable. He completed remeron tx. He gets nutritional supplements  HTN - controlled on lotrel 10/20 mg daily; apresoline 25 mg twice daily; lopressor 100 mg daily.   CAD - asymptomatic; takes lopressor 100 mg daily   GERD - stable on zantac 150 mg daily   Chronic diarrhea - stable on imodium 2 mg daily    Past Medical History:  Diagnosis Date  . Acute kidney failure ([place])   . Adult failure to thrive   . Anemia, unspecified   . Benign prostatic hyperplasia with lower urinary tract symptoms 10/01/2016  . Blind   . CAD (coronary artery disease)   . Chronic kidney disease   . Coronary atherosclerosis 04/10/2006   Qualifier: Diagnosis of  By: Hilma Favors  DO, Beth    . Deaf   . GERD (gastroesophageal reflux disease)   . Hyperkalemia   . Hyperlipidemia   . Hypertension   . Malignant neoplasm of kidney ([place])   . Mute   . Urinary tract infection, site not specified    Past Surgical History:  Procedure Laterality Date  . CHOLECYSTECTOMY    . left partial nephrectomy      Allergies  Allergen Reactions  . Aspirin Other (See Comments)    Reaction:  Unknown     Outpatient Encounter Medications as of 09/04/2017  Medication Sig  . acetaminophen (TYLENOL) 325 MG tablet Take 650 mg by mouth every 4 (four) hours as needed for  mild pain.  Marland Kitchen amLODipine-benazepril (LOTREL) 10-20 MG per capsule Take 1 capsule by mouth daily.  . Cranberry 450 MG CAPS Take 1 tablet by mouth daily.  . hydrALAZINE (APRESOLINE) 25 MG tablet Take 1 tablet (25 mg total) by mouth 2 (two) times daily.  Marland Kitchen loperamide (IMODIUM A-D) 2 MG tablet Take 2 mg by mouth daily as needed for diarrhea or loose stools.  . metoprolol (LOPRESSOR) 100 MG tablet Take 100 mg by mouth daily.  . Nutritional Supplements (NUTRITIONAL SUPPLEMENT PO) Take by mouth. Ensure -  Give 120 cc by mouth three times daily  . ranitidine (ZANTAC) 150 MG tablet Take 150 mg by mouth at bedtime.  . Tamsulosin HCl (FLOMAX) 0.4 MG CAPS  Take 1 capsule (0.4 mg total) by mouth daily.  . Vitamin D, Ergocalciferol, (DRISDOL) 50000 units CAPS capsule Take 50,000 Units by mouth every 30 (thirty) days.  . [DISCONTINUED] cephALEXin (KEFLEX) 500 MG capsule Take 1 capsule (500 mg total) by mouth 4 (four) times daily. (Patient not taking: Reported on 09/04/2017)   No facility-administered encounter medications on file as of 09/04/2017.     Review of Systems  Unable to perform ROS: Patient nonverbal    Immunization History  Administered Date(s) Administered  . Influenza Split 08/19/2011  . PPD Test 10/17/2016  . Pneumococcal-Unspecified 12/25/2016   Pertinent  Health Maintenance Due  Topic Date Due  . INFLUENZA VACCINE  01/23/2018 (Originally 01/22/2017)  . PNA vac Low Risk Adult (2 of 2 - PCV13) 12/25/2017   Fall Risk  12/23/2016  Falls in the past year? No   Functional Status Survey:    Vitals:   09/04/17 1248  BP: 138/72  Pulse: 97  Resp: 18  Temp: (!) 97.1 F (36.2 C)  SpO2: 98%  Weight: 102 lb 1.6 oz (46.3 kg)  Height: 5\' 6"  (1.676 m)   Body mass index is 16.48 kg/m. Physical Exam  Constitutional: He appears well-developed.  Frail appearing in NAD, lying in bed  HENT:  Mouth/Throat: Oropharynx is clear and moist.  Unable to examine oropharynx due to communication barrier  Eyes:  Blind in OU  Neck: Neck supple. Carotid bruit is not present.  Cardiovascular: Normal rate, regular rhythm and intact distal pulses. Exam reveals no gallop and no friction rub.  Murmur (1/6 SEM) heard. No distal LE edema. No calf TTP  Pulmonary/Chest: Effort normal and breath sounds normal. He has no wheezes. He has no rales. He exhibits no tenderness.  Abdominal: Soft. Normal appearance and bowel sounds are normal. He exhibits no distension, no abdominal bruit, no pulsatile midline mass and no mass. There is no hepatomegaly. There is no tenderness. There is no rigidity, no rebound and no guarding. No hernia.  Genitourinary:    Genitourinary Comments: Foley cath intact and  DTG dark yellow urine  Musculoskeletal: He exhibits edema.  Lymphadenopathy:    He has no cervical adenopathy.  Neurological: He is alert.  Skin: Skin is warm and dry. No rash noted.  Psychiatric: He has a normal mood and affect. His behavior is normal. He is noncommunicative.    Labs reviewed: Recent Labs    09/30/16 1500 10/01/16 0655  12/18/16 0115  04/02/17 1315 06/07/17 06/09/17 1445  NA 133* 131*   < > 139   < > 135 145 140  K 4.2 3.8   < > 3.6   < > 4.0 4.5 3.7  CL 102 101  --  104  --  103  --  108  CO2 23 23  --   --   --  22  --  25  GLUCOSE 168* 99  --  92  --  108*  --  93  BUN 17 14   < > 17   < > 20 20 22*  CREATININE 1.24 1.17   < > 1.00   < > 1.10 1.0 1.04  CALCIUM 8.5* 8.0*  --   --   --  8.9  --  9.0  MG 1.3* 1.8  --   --   --   --   --   --    < > = values in this interval not displayed.   Recent Labs    09/30/16 1500  04/02/17 1315 06/07/17 06/09/17 1445  AST 33   < > 14* 10* 18  ALT 21   < > 10* 8* 13*  ALKPHOS 45   < > 66 61 67  BILITOT 0.6  --  1.5*  --  0.6  PROT 6.1*  --  7.4  --  7.2  ALBUMIN 2.8*  --  3.8  --  3.5   < > = values in this interval not displayed.   Recent Labs    09/30/16 0829  02/20/17 04/02/17 1315 06/09/17 1445  WBC 12.2*   < > 8.2 13.0* 10.3  NEUTROABS  --    < > 5 9.9* 5.9  HGB 10.5*   < > 13.7 13.8 12.7*  HCT 30.4*   < > 42 40.9 37.8*  MCV 88.4  --   --  84.7 83.6  PLT 156   < > 253 311 292   < > = values in this interval not displayed.   Lab Results  Component Value Date   TSH 2.05 02/20/2017   Lab Results  Component Value Date   HGBA1C  03/07/2009    6.1 (NOTE) The ADA recommends the following therapeutic goal for glycemic control related to Hgb A1c measurement: Goal of therapy: <6.5 Hgb A1c  Reference: American Diabetes Association: Clinical Practice Recommendations 2010, Diabetes Care, 2010, 33: (Suppl  1).   Lab Results  Component Value Date   CHOL  118 06/07/2017   HDL 40 06/07/2017   LDLCALC 59 06/07/2017   TRIG 95 06/07/2017   CHOLHDL 3.7 08/19/2011    Significant Diagnostic Results in last 30 days:  No results found.  Assessment/Plan   ICD-10-CM   1. BPH with urinary obstruction N40.1    N13.8   2. Foley catheter in place Z92.89   3. Essential hypertension, benign I10   4. Atherosclerosis of native coronary artery of native heart with angina pectoris ([place]) I25.119   5. CKD (chronic kidney disease), stage III ([facility]) N18.3   6. Personal history of renal cancer Z85.528    Cont current meds as ordered  Nutritional supplements as indicated  PT/OT/ST as indicated  [facility] NP to follow  Will follow  Labs/tests ordered: none   Aayush Gelpi S. Perlie Gold  [facility] [address] [place], [place] [address] [phone] Cell (Monday-Friday 8 AM - 5 PM) [phone] After 5 PM and follow prompts

## 2017-10-24 DIAGNOSIS — Z79899 Other long term (current) drug therapy: Secondary | ICD-10-CM | POA: Diagnosis not present

## 2017-10-24 DIAGNOSIS — E878 Other disorders of electrolyte and fluid balance, not elsewhere classified: Secondary | ICD-10-CM | POA: Diagnosis not present

## 2017-10-24 DIAGNOSIS — D649 Anemia, unspecified: Secondary | ICD-10-CM | POA: Diagnosis not present

## 2017-11-17 ENCOUNTER — Encounter: Payer: Self-pay | Admitting: Internal Medicine

## 2017-11-17 ENCOUNTER — Non-Acute Institutional Stay (SKILLED_NURSING_FACILITY): Payer: Medicare Other | Admitting: Internal Medicine

## 2017-11-17 DIAGNOSIS — R627 Adult failure to thrive: Secondary | ICD-10-CM

## 2017-11-17 DIAGNOSIS — F0390 Unspecified dementia without behavioral disturbance: Secondary | ICD-10-CM

## 2017-11-17 DIAGNOSIS — N401 Enlarged prostate with lower urinary tract symptoms: Secondary | ICD-10-CM | POA: Diagnosis not present

## 2017-11-17 DIAGNOSIS — H543 Unqualified visual loss, both eyes: Secondary | ICD-10-CM | POA: Diagnosis not present

## 2017-11-17 DIAGNOSIS — N138 Other obstructive and reflux uropathy: Secondary | ICD-10-CM

## 2017-11-17 DIAGNOSIS — I10 Essential (primary) hypertension: Secondary | ICD-10-CM

## 2017-11-17 DIAGNOSIS — H913 Deaf nonspeaking, not elsewhere classified: Secondary | ICD-10-CM

## 2017-11-17 DIAGNOSIS — Z85528 Personal history of other malignant neoplasm of kidney: Secondary | ICD-10-CM

## 2017-11-17 NOTE — Progress Notes (Signed)
Patient ID: JABREEL CHIMENTO, male   DOB: 1929/09/25, 84 y.o.   MRN: [id number]  Location:  [facility] Room Number: 259D Place of Service:  SNF (31) Provider:  DR Dinara Lupu Gwendalyn Ege, DO  Patient Care Team: Gildardo Cranker, DO as PCP - General (Internal Medicine) Center, [place] ([place])  Extended Emergency Contact Information Primary Emergency Contact: Graffeo,[name] Address: [address]          [place], [place] [address] [name] Phone: [phone] Relation: Other Secondary Emergency Contact: [name] Address: [address]          [place], [place] [address] [place] Phone: [phone] Relation: Niece  Code Status:  DNR Goals of care: Advanced Directive information Advanced Directives 09/04/2017  Does Patient Have a Medical Advance Directive? Yes  Type of Advance Directive Out of facility DNR (pink MOST or yellow form)  Does patient want to make changes to medical advance directive? No - Patient declined  Would patient like information on creating a medical advance directive? -  Pre-existing out of facility DNR order (yellow form or pink MOST form) Yellow form placed in chart (order not valid for inpatient use);Pink MOST form placed in chart (order not valid for inpatient use)     Chief Complaint  Patient presents with  . Medical Management of Chronic Issues    [facility]    HPI:  Pt is a 84 y.o. male seen today for medical management of chronic diseases.  No nursing concerns. Appetite is good and sleeps well. He is blind and deaf. No falls. BIMS score 0/15. He is a poor historian due to dementia/psych d/o. Hx obtained from chart.  BPH with LUTS- chronic foley cath in place. He takes flomax 0.4 mg daily. He has hx urinary retention   Hx CKD stage 3/hx malignant neoplasm of left kidney - s/p partial  left nephrectomy; Cr 1.04  FTT - albumin 3.5; weight trending up. He  completed remeron tx. He gets nutritional supplements per facility protocol.  HTN - BP stable on lotrel 10/20 mg daily; apresoline 25 mg twice daily; lopressor 100 mg daily.   CAD - asymptomatic; takes lopressor 100 mg daily. He has an ASA allergy  GERD - controlled on zantac 150 mg daily   Chronic diarrhea - controlled on imodium 2 mg daily   Deaf/mute/blind - unchanged  Dementia - unchanged. He gets nutritional supplements per facility protocol. Weight stable. BIMS score 0/15  Past Medical History:  Diagnosis Date  . Acute kidney failure ([place])   . Adult failure to thrive   . Anemia, unspecified   . Benign prostatic hyperplasia with lower urinary tract symptoms 10/01/2016  . Blind   . CAD (coronary artery disease)   . Chronic kidney disease   . Coronary atherosclerosis 04/10/2006   Qualifier: Diagnosis of  By: Hilma Favors  DO, Beth    . Deaf   . GERD (gastroesophageal reflux disease)   . Hyperkalemia   . Hyperlipidemia   . Hypertension   . Malignant neoplasm of kidney ([place])   . Mute   . Urinary tract infection, site not specified    Past Surgical History:  Procedure Laterality Date  . CHOLECYSTECTOMY    . left partial nephrectomy      Allergies  Allergen Reactions  . Aspirin Other (See Comments)    Reaction:  Unknown     Outpatient Encounter Medications as of 11/17/2017  Medication  Sig  . acetaminophen (TYLENOL) 325 MG tablet Take 650 mg by mouth every 4 (four) hours as needed for mild pain.  Marland Kitchen amLODipine-benazepril (LOTREL) 10-20 MG per capsule Take 1 capsule by mouth daily.  . Cranberry 450 MG CAPS Take 1 tablet by mouth daily.  . hydrALAZINE (APRESOLINE) 25 MG tablet Take 1 tablet (25 mg total) by mouth 2 (two) times daily.  Marland Kitchen loperamide (IMODIUM A-D) 2 MG tablet Take 2 mg by mouth daily as needed for diarrhea or loose stools.  . metoprolol (LOPRESSOR) 100 MG tablet Take 100 mg by mouth daily.  . Nutritional Supplements (NUTRITIONAL SUPPLEMENT PO) Take by mouth.  Ensure -  Give 120 cc by mouth three times daily  . ranitidine (ZANTAC) 150 MG tablet Take 150 mg by mouth at bedtime.  . Tamsulosin HCl (FLOMAX) 0.4 MG CAPS Take 1 capsule (0.4 mg total) by mouth daily.  . Vitamin D, Ergocalciferol, (DRISDOL) 50000 units CAPS capsule Take 50,000 Units by mouth every 30 (thirty) days.   No facility-administered encounter medications on file as of 11/17/2017.     Review of Systems  Unable to perform ROS: Dementia    Immunization History  Administered Date(s) Administered  . Influenza Split 08/19/2011  . PPD Test 10/17/2016  . Pneumococcal-Unspecified 12/25/2016   Pertinent  Health Maintenance Due  Topic Date Due  . INFLUENZA VACCINE  01/23/2018 (Originally 01/22/2018)  . PNA vac Low Risk Adult (2 of 2 - PCV13) 12/25/2017   Fall Risk  12/23/2016  Falls in the past year? No   Functional Status Survey:    Vitals:   11/17/17 1304  BP: 134/80  Pulse: 81  Temp: 98 F (36.7 C)  SpO2: 98%  Weight: 102 lb 6.4 oz (46.4 kg)   Body mass index is 16.53 kg/m. Physical Exam  Constitutional: He appears well-developed and well-nourished.  Sitting up in bed in NAD  HENT:  Mouth/Throat: Oropharynx is clear and moist.  MMM; no oral thrush  Eyes:  Blind OU  Neck: Neck supple. Carotid bruit is not present.  Cardiovascular: Normal rate, regular rhythm and intact distal pulses. Exam reveals no gallop and no friction rub.  Murmur (1/6 SEM) heard. no distal LE swelling. No calf TTP  Pulmonary/Chest: Effort normal and breath sounds normal. No respiratory distress. He has no wheezes. He has no rales. He exhibits no tenderness.  Abdominal: Soft. Bowel sounds are normal. He exhibits distension. He exhibits no abdominal bruit, no pulsatile midline mass and no mass. There is no hepatomegaly. There is no tenderness. There is no rebound and no guarding.  obese  Genitourinary:  Genitourinary Comments: Foley cath intact and DTG clear yellow urine  Musculoskeletal: He  exhibits edema.  Lymphadenopathy:    He has no cervical adenopathy.  Neurological: He is alert. He has normal reflexes.  Skin: Skin is warm and dry. No rash noted.  Psychiatric: He has a normal mood and affect. His behavior is normal. He is noncommunicative.    Labs reviewed: Recent Labs    12/18/16 0115  04/02/17 1315 06/07/17 06/09/17 1445  NA 139   < > 135 145 140  K 3.6   < > 4.0 4.5 3.7  CL 104  --  103  --  108  CO2  --   --  22  --  25  GLUCOSE 92  --  108*  --  93  BUN 17   < > 20 20 22*  CREATININE 1.00   < > 1.10  1.0 1.04  CALCIUM  --   --  8.9  --  9.0   < > = values in this interval not displayed.   Recent Labs    04/02/17 1315 06/07/17 06/09/17 1445  AST 14* 10* 18  ALT 10* 8* 13*  ALKPHOS 66 61 67  BILITOT 1.5*  --  0.6  PROT 7.4  --  7.2  ALBUMIN 3.8  --  3.5   Recent Labs    02/20/17 04/02/17 1315 06/09/17 1445  WBC 8.2 13.0* 10.3  NEUTROABS 5 9.9* 5.9  HGB 13.7 13.8 12.7*  HCT 42 40.9 37.8*  MCV  --  84.7 83.6  PLT 253 311 292   Lab Results  Component Value Date   TSH 2.05 02/20/2017   Lab Results  Component Value Date   HGBA1C  03/07/2009    6.1 (NOTE) The ADA recommends the following therapeutic goal for glycemic control related to Hgb A1c measurement: Goal of therapy: <6.5 Hgb A1c  Reference: American Diabetes Association: Clinical Practice Recommendations 2010, Diabetes Care, 2010, 33: (Suppl  1).   Lab Results  Component Value Date   CHOL 118 06/07/2017   HDL 40 06/07/2017   LDLCALC 59 06/07/2017   TRIG 95 06/07/2017   CHOLHDL 3.7 08/19/2011    Significant Diagnostic Results in last 30 days:  No results found.  Assessment/Plan   ICD-10-CM   1. BPH with urinary obstruction N40.1    N13.8   2. Failure to thrive in adult R62.7   3. Essential hypertension, benign I10   4. Deaf mutism, acquired H91.3   5. Blindness of both eyes H54.3   6. Personal history of renal cancer Z85.528   7. Dementia without behavioral  disturbance, unspecified dementia type F03.90     Cont current meds as ordered  Cont nutritional supplements as ordered  Foley cath care as indicated  PT/OT/ST as indicated  [facility] NP to follow  Will follow  Labs/tests ordered: none   Gatha Mcnulty S. Perlie Gold  [facility] [address] [place], [place] [address] [phone] Cell (Monday-Friday 8 AM - 5 PM) [phone] After 5 PM and follow prompts

## 2017-11-30 ENCOUNTER — Encounter: Payer: Self-pay | Admitting: Internal Medicine

## 2017-12-25 ENCOUNTER — Encounter: Payer: Self-pay | Admitting: Internal Medicine

## 2017-12-25 ENCOUNTER — Non-Acute Institutional Stay (SKILLED_NURSING_FACILITY): Payer: Medicare Other | Admitting: Internal Medicine

## 2017-12-25 DIAGNOSIS — I10 Essential (primary) hypertension: Secondary | ICD-10-CM | POA: Diagnosis not present

## 2017-12-25 DIAGNOSIS — N401 Enlarged prostate with lower urinary tract symptoms: Secondary | ICD-10-CM

## 2017-12-25 DIAGNOSIS — N183 Chronic kidney disease, stage 3 unspecified: Secondary | ICD-10-CM

## 2017-12-25 DIAGNOSIS — F0390 Unspecified dementia without behavioral disturbance: Secondary | ICD-10-CM

## 2017-12-25 DIAGNOSIS — K219 Gastro-esophageal reflux disease without esophagitis: Secondary | ICD-10-CM

## 2017-12-25 DIAGNOSIS — K529 Noninfective gastroenteritis and colitis, unspecified: Secondary | ICD-10-CM

## 2017-12-25 DIAGNOSIS — N138 Other obstructive and reflux uropathy: Secondary | ICD-10-CM

## 2017-12-25 NOTE — Progress Notes (Deleted)
Patient ID: Alexander Rosales, male   DOB: 27-Oct-1929, 84 y.o.   MRN: [id number]   Location:  [facility] OFFICE  Provider: DR Arletha Grippe  Code Status: DNR Goals of Care:  Advanced Directives 09/04/2017  Does Patient Have a Medical Advance Directive? Yes  Type of Advance Directive Out of facility DNR (pink MOST or yellow form)  Does patient want to make changes to medical advance directive? No - Patient declined  Would patient like information on creating a medical advance directive? -  Pre-existing out of facility DNR order (yellow form or pink MOST form) Yellow form placed in chart (order not valid for inpatient use);Pink MOST form placed in chart (order not valid for inpatient use)     Chief Complaint  Patient presents with  . Medical Management of Chronic Issues    OPTUM    HPI: Patient is a 84 y.o. male seen today for medical management of chronic diseases.  He is c/a BM in brief this AM. No recent falls. No nursing issues. Appetite ok and sleeps well. He is a poor historian due to dementia/psych d/o. Hx obtained from chart. He is blind and deaf.  BPH with LUTS- chronic foley cath in place. He takes flomax 0.4 mg daily. He has hx urinary retention   Hx CKD stage 3/hx malignant neoplasm of left kidney - s/p partial  left nephrectomy; Cr 1.04  FTT - albumin 3.5; weight trending up. He completed remeron tx. He gets nutritional supplements per facility protocol.  HTN - BP stable on lotrel 10/20 mg daily; apresoline 25 mg twice daily; lopressor 100 mg daily.   CAD - asymptomatic; takes lopressor 100 mg daily. He has an ASA allergy  GERD - controlled on zantac 150 mg daily   Chronic diarrhea - controlled on imodium 2 mg daily   Deaf/mute/blind - unchanged  Dementia - unchanged. He gets nutritional supplements per facility protocol. Weight stable. BIMS score 0/15    Past Medical History:  Diagnosis Date  . Acute kidney failure ([place])   . Adult failure to thrive   . Anemia,  unspecified   . Benign prostatic hyperplasia with lower urinary tract symptoms 10/01/2016  . Blind   . CAD (coronary artery disease)   . Chronic kidney disease   . Coronary atherosclerosis 04/10/2006   Qualifier: Diagnosis of  By: Hilma Favors  DO, Beth    . Deaf   . GERD (gastroesophageal reflux disease)   . Hyperkalemia   . Hyperlipidemia   . Hypertension   . Malignant neoplasm of kidney ([place])   . Mute   . Urinary tract infection, site not specified     Past Surgical History:  Procedure Laterality Date  . CHOLECYSTECTOMY    . left partial nephrectomy       reports that he has never smoked. He has never used smokeless tobacco. He reports that he does not drink alcohol or use drugs. Social History   Socioeconomic History  . Marital status: Single    Spouse name: Not on file  . Number of children: Not on file  . Years of education: Not on file  . Highest education level: Not on file  Occupational History  . Not on file  Social Needs  . Financial resource strain: Not on file  . Food insecurity:    Worry: Not on file    Inability: Not on file  . Transportation needs:    Medical: Not on file    Non-medical: Not on file  Tobacco Use  . Smoking status: Never Smoker  . Smokeless tobacco: Never Used  Substance and Sexual Activity  . Alcohol use: No  . Drug use: No  . Sexual activity: Not on file  Lifestyle  . Physical activity:    Days per week: Not on file    Minutes per session: Not on file  . Stress: Not on file  Relationships  . Social connections:    Talks on phone: Not on file    Gets together: Not on file    Attends religious service: Not on file    Active member of club or organization: Not on file    Attends meetings of clubs or organizations: Not on file    Relationship status: Not on file  . Intimate partner violence:    Fear of current or ex partner: Not on file    Emotionally abused: Not on file    Physically abused: Not on file    Forced sexual  activity: Not on file  Other Topics Concern  . Not on file  Social History Narrative  . Not on file    History reviewed. No pertinent family history.  Allergies  Allergen Reactions  . Aspirin Other (See Comments)    Reaction:  Unknown     Outpatient Encounter Medications as of 12/25/2017  Medication Sig  . acetaminophen (TYLENOL) 325 MG tablet Take 650 mg by mouth every 4 (four) hours as needed for mild pain.  Marland Kitchen amLODipine-benazepril (LOTREL) 10-20 MG per capsule Take 1 capsule by mouth daily.  . Cranberry 450 MG CAPS Take 1 tablet by mouth daily.  . hydrALAZINE (APRESOLINE) 25 MG tablet Take 1 tablet (25 mg total) by mouth 2 (two) times daily.  Marland Kitchen loperamide (IMODIUM A-D) 2 MG tablet Take 2 mg by mouth daily as needed for diarrhea or loose stools.  . metoprolol (LOPRESSOR) 100 MG tablet Take 100 mg by mouth daily.  . Nutritional Supplements (NUTRITIONAL SUPPLEMENT PO) Take by mouth. Ensure -  Give 120 cc by mouth three times daily  . ranitidine (ZANTAC) 150 MG tablet Take 150 mg by mouth at bedtime.  . Tamsulosin HCl (FLOMAX) 0.4 MG CAPS Take 1 capsule (0.4 mg total) by mouth daily.  . Vitamin D, Ergocalciferol, (DRISDOL) 50000 units CAPS capsule Take 50,000 Units by mouth every 30 (thirty) days.   No facility-administered encounter medications on file as of 12/25/2017.     Review of Systems:  Review of Systems  Unable to perform ROS: Dementia    Health Maintenance  Topic Date Due  . TETANUS/TDAP  07/31/1950  . PNA vac Low Risk Adult (2 of 2 - PCV13) 12/25/2017  . INFLUENZA VACCINE  01/23/2018 (Originally 01/22/2018)    Physical Exam: Vitals:   12/25/17 1610  BP: 120/80  Pulse: 70  Temp: 98 F (36.7 C)  SpO2: 98%  Weight: 101 lb (45.8 kg)   Body mass index is 16.3 kg/m. Physical Exam  Constitutional: He appears well-developed and well-nourished.  Frail appearing, lying in bed in NAD  HENT:  Mouth/Throat: Oropharynx is clear and moist.  MMM; no oral thrush  Eyes:   He is blind in OU  Neck: Neck supple. Carotid bruit is not present. No thyromegaly present.  Cardiovascular: Normal rate, regular rhythm and intact distal pulses. Exam reveals no gallop and no friction rub.  Murmur (1/6 SEM) heard. no distal LE swelling. No calf TTP  Pulmonary/Chest: Effort normal and breath sounds normal. He has no wheezes. He has no  rales. He exhibits no tenderness.  Abdominal: Soft. Bowel sounds are normal. He exhibits no distension, no abdominal bruit, no pulsatile midline mass and no mass. There is no hepatomegaly. There is no tenderness. There is no rebound and no guarding.  Musculoskeletal: He exhibits edema.  Lymphadenopathy:    He has no cervical adenopathy.  Neurological: He is alert. He has normal reflexes.  Skin: Skin is warm and dry. No rash noted.  Psychiatric: He has a normal mood and affect. His behavior is normal.  Unintelligible speech    Labs reviewed: Basic Metabolic Panel: Recent Labs    02/20/17 04/02/17 1315 06/07/17 06/09/17 1445  NA 140 135 145 140  K 4.1 4.0 4.5 3.7  CL  --  103  --  108  CO2  --  22  --  25  GLUCOSE  --  108*  --  93  BUN 11 20 20  22*  CREATININE 0.8 1.10 1.0 1.04  CALCIUM  --  8.9  --  9.0  TSH 2.05  --   --   --    Liver Function Tests: Recent Labs    04/02/17 1315 06/07/17 06/09/17 1445  AST 14* 10* 18  ALT 10* 8* 13*  ALKPHOS 66 61 67  BILITOT 1.5*  --  0.6  PROT 7.4  --  7.2  ALBUMIN 3.8  --  3.5   No results for input(s): LIPASE, AMYLASE in the last 8760 hours. No results for input(s): AMMONIA in the last 8760 hours. CBC: Recent Labs    02/20/17 04/02/17 1315 06/09/17 1445  WBC 8.2 13.0* 10.3  NEUTROABS 5 9.9* 5.9  HGB 13.7 13.8 12.7*  HCT 42 40.9 37.8*  MCV  --  84.7 83.6  PLT 253 311 292   Lipid Panel: Recent Labs    06/07/17  CHOL 118  HDL 40  LDLCALC 59  TRIG 95   Lab Results  Component Value Date   HGBA1C  03/07/2009    6.1 (NOTE) The ADA recommends the following therapeutic  goal for glycemic control related to Hgb A1c measurement: Goal of therapy: <6.5 Hgb A1c  Reference: American Diabetes Association: Clinical Practice Recommendations 2010, Diabetes Care, 2010, 33: (Suppl  1).    Procedures since last visit: No results found.  Assessment/Plan     Carles Florea S. Perlie Gold  [facility] [address] [place], [place] [address] [phone] Cell (Monday-Friday 8 AM - 5 PM) [phone] After 5 PM and follow prompts

## 2018-01-01 ENCOUNTER — Non-Acute Institutional Stay (SKILLED_NURSING_FACILITY): Payer: Medicare Other

## 2018-01-01 DIAGNOSIS — Z0000 Encounter for general adult medical examination without abnormal findings: Secondary | ICD-10-CM

## 2018-01-01 NOTE — Progress Notes (Addendum)
Subjective:   Alexander Rosales is a 84 y.o. male who presents for Medicare Annual/Subsequent preventive examination at [place]; incapacitated patient unable to answer questions appropriately  Last AWV-12/23/2016       Objective:    Vitals: BP 125/82 (BP Location: Right Arm, Patient Position: Supine)   Pulse 72   Temp 98.1 F (36.7 C) (Oral)   Ht 5\' 6"  (1.676 m)   Wt 101 lb (45.8 kg)   BMI 16.30 kg/m   Body mass index is 16.3 kg/m.  Advanced Directives 01/01/2018 09/04/2017 07/14/2017 06/24/2017 06/09/2017 05/28/2017 04/25/2017  Does Patient Have a Medical Advance Directive? Yes Yes Yes Yes No;Yes Yes Yes  Type of Advance Directive Out of facility DNR (pink MOST or yellow form) Out of facility DNR (pink MOST or yellow form) Out of facility DNR (pink MOST or yellow form) Out of facility DNR (pink MOST or yellow form) Out of facility DNR (pink MOST or yellow form) Out of facility DNR (pink MOST or yellow form) Out of facility DNR (pink MOST or yellow form)  Does patient want to make changes to medical advance directive? - No - Patient declined No - Patient declined - - No - Patient declined No - Patient declined  Would patient like information on creating a medical advance directive? - - - - - - -  Pre-existing out of facility DNR order (yellow form or pink MOST form) Pink MOST form placed in chart (order not valid for inpatient use);Yellow form placed in chart (order not valid for inpatient use) Yellow form placed in chart (order not valid for inpatient use);Pink MOST form placed in chart (order not valid for inpatient use) Yellow form placed in chart (order not valid for inpatient use);Pink MOST form placed in chart (order not valid for inpatient use) - - Yellow form placed in chart (order not valid for inpatient use);Pink MOST form placed in chart (order not valid for inpatient use) Yellow form placed in chart (order not valid for inpatient use);Pink MOST form placed in chart  (order not valid for inpatient use)    Tobacco Social History   Tobacco Use  Smoking Status Never Smoker  Smokeless Tobacco Never Used     Counseling given: Not Answered   Clinical Intake:  Pre-visit preparation completed: No  Pain : Faces Faces Pain Scale: No hurt  Faces Pain Scale: No hurt  Nutritional Risks: None Diabetes: No        Comments: patinet is blind an deaf Information entered by :: Tyson Dense, RN  Past Medical History:  Diagnosis Date  . Acute kidney failure ([place])   . Adult failure to thrive   . Anemia, unspecified   . Benign prostatic hyperplasia with lower urinary tract symptoms 10/01/2016  . Blind   . CAD (coronary artery disease)   . Chronic kidney disease   . Coronary atherosclerosis 04/10/2006   Qualifier: Diagnosis of  By: Hilma Favors  DO, Beth    . Deaf   . GERD (gastroesophageal reflux disease)   . Hyperkalemia   . Hyperlipidemia   . Hypertension   . Malignant neoplasm of kidney ([place])   . Mute   . Urinary tract infection, site not specified    Past Surgical History:  Procedure Laterality Date  . CHOLECYSTECTOMY    . left partial nephrectomy     History reviewed. No pertinent family history. Social History   Socioeconomic History  . Marital status: Single    Spouse  name: Not on file  . Number of children: Not on file  . Years of education: Not on file  . Highest education level: Not on file  Occupational History  . Not on file  Social Needs  . Financial resource strain: Not on file  . Food insecurity:    Worry: Not on file    Inability: Not on file  . Transportation needs:    Medical: Not on file    Non-medical: Not on file  Tobacco Use  . Smoking status: Never Smoker  . Smokeless tobacco: Never Used  Substance and Sexual Activity  . Alcohol use: No  . Drug use: No  . Sexual activity: Not on file  Lifestyle  . Physical activity:    Days per week: Not on file    Minutes per session: Not on file  . Stress: Not  on file  Relationships  . Social connections:    Talks on phone: Not on file    Gets together: Not on file    Attends religious service: Not on file    Active member of club or organization: Not on file    Attends meetings of clubs or organizations: Not on file    Relationship status: Not on file  Other Topics Concern  . Not on file  Social History Narrative  . Not on file    Outpatient Encounter Medications as of 01/01/2018  Medication Sig  . acetaminophen (TYLENOL) 325 MG tablet Take 650 mg by mouth every 4 (four) hours as needed for mild pain.  Marland Kitchen amLODipine-benazepril (LOTREL) 10-20 MG per capsule Take 1 capsule by mouth daily.  . Cranberry 450 MG CAPS Take 1 tablet by mouth daily.  . hydrALAZINE (APRESOLINE) 25 MG tablet Take 1 tablet (25 mg total) by mouth 2 (two) times daily.  Marland Kitchen loperamide (IMODIUM A-D) 2 MG tablet Take 2 mg by mouth daily as needed for diarrhea or loose stools.  . metoprolol (LOPRESSOR) 100 MG tablet Take 100 mg by mouth daily.  . Nutritional Supplements (NUTRITIONAL SUPPLEMENT PO) Take by mouth. Ensure -  Give 120 cc by mouth three times daily  . ranitidine (ZANTAC) 150 MG tablet Take 150 mg by mouth at bedtime.  . Tamsulosin HCl (FLOMAX) 0.4 MG CAPS Take 1 capsule (0.4 mg total) by mouth daily.  . Vitamin D, Ergocalciferol, (DRISDOL) 50000 units CAPS capsule Take 50,000 Units by mouth every 30 (thirty) days.   No facility-administered encounter medications on file as of 01/01/2018.     Activities of Daily Living In your present state of health, do you have any difficulty performing the following activities: 01/01/2018  Hearing? Y  Vision? Y  Difficulty concentrating or making decisions? N  Walking or climbing stairs? Y  Dressing or bathing? Y  Doing errands, shopping? Y  Preparing Food and eating ? Y  Using the Toilet? Y  In the past six months, have you accidently leaked urine? Y  Do you have problems with loss of bowel control? Y  Managing your  Medications? Y  Managing your Finances? Y  Housekeeping or managing your Housekeeping? Y  Some recent data might be hidden    Patient Care Team: Gildardo Cranker, DO as PCP - [facility] ([facility], [place] ([place])   Assessment:   This is a routine wellness examination for [place].  Exercise Activities and Dietary recommendations Current Exercise Habits: The patient does not participate in regular exercise at present, Exercise limited by: Other - see comments(blind  and deaf)  Goals    None      Fall Risk Fall Risk  01/01/2018 12/23/2016  Falls in the past year? No No   Is the patient's home free of loose throw rugs in walkways, pet beds, electrical cords, etc?   yes      Grab bars in the bathroom? yes      Handrails on the stairs?   yes      Adequate lighting?   yes  Depression Screen PHQ 2/9 Scores 01/01/2018 12/23/2016 09/16/2011 08/19/2011  PHQ - 2 Score - - 0 0  Exception Documentation Medical reason Medical reason - -    Cognitive Function MMSE - Mini Mental State Exam 01/01/2018 12/23/2016  Not completed: Unable to complete Unable to complete        Immunization History  Administered Date(s) Administered  . Influenza Split 08/19/2011  . PPD Test 10/17/2016  . Pneumococcal-Unspecified 12/25/2016    Qualifies for Shingles Vaccine? Not in past records  Screening Tests Health Maintenance  Topic Date Due  . TETANUS/TDAP  07/31/1950  . PNA vac Low Risk Adult (2 of 2 - PCV13) 12/25/2017  . INFLUENZA VACCINE  01/23/2018 (Originally 01/22/2018)   Cancer Screenings: Lung: Low Dose CT Chest recommended if Age 87-80 years, 30 pack-year currently smoking OR have quit w/in 15years. Patient does not qualify. Colorectal: excluded  Prevnar due: ordered TDAP due: facility declined    Plan:    I have personally reviewed and addressed the Medicare Annual Wellness questionnaire and have noted the following in the patient's chart:  A. Medical  and social history B. Use of alcohol, tobacco or illicit drugs  C. Current medications and supplements D. Functional ability and status E.  Nutritional status F.  Physical activity G. Advance directives H. List of other physicians I.  Hospitalizations, surgeries, and ER visits in previous 12 months J.  [place] to include hearing, vision, cognitive, depression L. Referrals and appointments - none  In addition, I am unable to review and discuss with incapacitated patient certain preventive protocols, quality metrics, and best practice recommendations. A written personalized care plan for preventive services as well as general preventive health recommendations were provided to patient.   See attached scanned questionnaire for additional information.   Signed,   Tyson Dense, RN Nurse Health Advisor

## 2018-01-01 NOTE — Patient Instructions (Signed)
Mr. Alexander Rosales , Thank you for taking time to come for your Medicare Wellness Visit. I appreciate your ongoing commitment to your health goals. Please review the following plan we discussed and let me know if I can assist you in the future.   Screening recommendations/referrals: Colonoscopy excluded, over age 82 Recommended yearly ophthalmology/optometry visit for glaucoma screening and checkup Recommended yearly dental visit for hygiene and checkup  Vaccinations: Influenza vaccine due 2019 fall season Pneumococcal vaccine 13 due, ordered Tdap vaccine due, facility declined Shingles vaccine not in past records    Advanced directives: in chart  Conditions/risks identified: none  Next appointment: Dr. Eulas Post makes rounds  Preventive Care 24 Years and Older, Male Preventive care refers to lifestyle choices and visits with your health care provider that can promote health and wellness. What does preventive care include?  A yearly physical exam. This is also called an annual well check.  Dental exams once or twice a year.  Routine eye exams. Ask your health care provider how often you should have your eyes checked.  Personal lifestyle choices, including:  Daily care of your teeth and gums.  Regular physical activity.  Eating a healthy diet.  Avoiding tobacco and drug use.  Limiting alcohol use.  Practicing safe sex.  Taking low doses of aspirin every day.  Taking vitamin and mineral supplements as recommended by your health care provider. What happens during an annual well check? The services and screenings done by your health care provider during your annual well check will depend on your age, overall health, lifestyle risk factors, and family history of disease. Counseling  Your health care provider may ask you questions about your:  Alcohol use.  Tobacco use.  Drug use.  Emotional well-being.  Home and relationship well-being.  Sexual activity.  Eating  habits.  History of falls.  Memory and ability to understand (cognition).  Work and work Statistician. Screening  You may have the following tests or measurements:  Height, weight, and BMI.  Blood pressure.  Lipid and cholesterol levels. These may be checked every 5 years, or more frequently if you are over 69 years old.  Skin check.  Lung cancer screening. You may have this screening every year starting at age 49 if you have a 30-pack-year history of smoking and currently smoke or have quit within the past 15 years.  Fecal occult blood test (FOBT) of the stool. You may have this test every year starting at age 23.  Flexible sigmoidoscopy or colonoscopy. You may have a sigmoidoscopy every 5 years or a colonoscopy every 10 years starting at age 69.  Prostate cancer screening. Recommendations will vary depending on your family history and other risks.  Hepatitis C blood test.  Hepatitis B blood test.  Sexually transmitted disease (STD) testing.  Diabetes screening. This is done by checking your blood sugar (glucose) after you have not eaten for a while (fasting). You may have this done every 1-3 years.  Abdominal aortic aneurysm (AAA) screening. You may need this if you are a current or former smoker.  Osteoporosis. You may be screened starting at age 57 if you are at high risk. Talk with your health care provider about your test results, treatment options, and if necessary, the need for more tests. Vaccines  Your health care provider may recommend certain vaccines, such as:  Influenza vaccine. This is recommended every year.  Tetanus, diphtheria, and acellular pertussis (Tdap, Td) vaccine. You may need a Td booster every 10 years.  Zoster vaccine. You may need this after age 61.  Pneumococcal 13-valent conjugate (PCV13) vaccine. One dose is recommended after age 41.  Pneumococcal polysaccharide (PPSV23) vaccine. One dose is recommended after age 64. Talk to your health  care provider about which screenings and vaccines you need and how often you need them. This information is not intended to replace advice given to you by your health care provider. Make sure you discuss any questions you have with your health care provider. Document Released: 07/07/2015 Document Revised: 02/28/2016 Document Reviewed: 04/11/2015 Elsevier Interactive Patient Education  2017 [place] Prevention in the Home Falls can cause injuries. They can happen to people of all ages. There are many things you can do to make your home safe and to help prevent falls. What can I do on the outside of my home?  Regularly fix the edges of walkways and driveways and fix any cracks.  Remove anything that might make you trip as you walk through a door, such as a raised step or threshold.  Trim any bushes or trees on the path to your home.  Use bright outdoor lighting.  Clear any walking paths of anything that might make someone trip, such as rocks or tools.  Regularly check to see if handrails are loose or broken. Make sure that both sides of any steps have handrails.  Any raised decks and porches should have guardrails on the edges.  Have any leaves, snow, or ice cleared regularly.  Use sand or salt on walking paths during winter.  Clean up any spills in your garage right away. This includes oil or grease spills. What can I do in the bathroom?  Use night lights.  Install grab bars by the toilet and in the tub and shower. Do not use towel bars as grab bars.  Use non-skid mats or decals in the tub or shower.  If you need to sit down in the shower, use a plastic, non-slip stool.  Keep the floor dry. Clean up any water that spills on the floor as soon as it happens.  Remove soap buildup in the tub or shower regularly.  Attach bath mats securely with double-sided non-slip rug tape.  Do not have throw rugs and other things on the floor that can make you trip. What can I do  in the bedroom?  Use night lights.  Make sure that you have a light by your bed that is easy to reach.  Do not use any sheets or blankets that are too big for your bed. They should not hang down onto the floor.  Have a firm chair that has side arms. You can use this for support while you get dressed.  Do not have throw rugs and other things on the floor that can make you trip. What can I do in the kitchen?  Clean up any spills right away.  Avoid walking on wet floors.  Keep items that you use a lot in easy-to-reach places.  If you need to reach something above you, use a strong step stool that has a grab bar.  Keep electrical cords out of the way.  Do not use floor polish or wax that makes floors slippery. If you must use wax, use non-skid floor wax.  Do not have throw rugs and other things on the floor that can make you trip. What can I do with my stairs?  Do not leave any items on the stairs.  Make sure that there are  handrails on both sides of the stairs and use them. Fix handrails that are broken or loose. Make sure that handrails are as long as the stairways.  Check any carpeting to make sure that it is firmly attached to the stairs. Fix any carpet that is loose or worn.  Avoid having throw rugs at the top or bottom of the stairs. If you do have throw rugs, attach them to the floor with carpet tape.  Make sure that you have a light switch at the top of the stairs and the bottom of the stairs. If you do not have them, ask someone to add them for you. What else can I do to help prevent falls?  Wear shoes that:  Do not have high heels.  Have rubber bottoms.  Are comfortable and fit you well.  Are closed at the toe. Do not wear sandals.  If you use a stepladder:  Make sure that it is fully opened. Do not climb a closed stepladder.  Make sure that both sides of the stepladder are locked into place.  Ask someone to hold it for you, if possible.  Clearly mark  and make sure that you can see:  Any grab bars or handrails.  First and last steps.  Where the edge of each step is.  Use tools that help you move around (mobility aids) if they are needed. These include:  Canes.  Walkers.  Scooters.  Crutches.  Turn on the lights when you go into a dark area. Replace any light bulbs as soon as they burn out.  Set up your furniture so you have a clear path. Avoid moving your furniture around.  If any of your floors are uneven, fix them.  If there are any pets around you, be aware of where they are.  Review your medicines with your doctor. Some medicines can make you feel dizzy. This can increase your chance of falling. Ask your doctor what other things that you can do to help prevent falls. This information is not intended to replace advice given to you by your health care provider. Make sure you discuss any questions you have with your health care provider. Document Released: 04/06/2009 Document Revised: 11/16/2015 Document Reviewed: 07/15/2014 Elsevier Interactive Patient Education  2017 [employer].

## 2018-01-08 DIAGNOSIS — R4701 Aphasia: Secondary | ICD-10-CM | POA: Diagnosis not present

## 2018-01-08 DIAGNOSIS — R338 Other retention of urine: Secondary | ICD-10-CM | POA: Diagnosis not present

## 2018-01-08 DIAGNOSIS — Q541 Hypospadias, penile: Secondary | ICD-10-CM | POA: Diagnosis not present

## 2018-02-10 NOTE — Progress Notes (Signed)
Patient ID: Alexander Rosales, male   DOB: 06-11-1930, 84 y.o.   MRN: [id number]  Location:  [facility]   Place of Service:  SNF (31) Provider:  Edgar, [place], DO  Patient Care Team: Alexander Cranker, DO as PCP - General (Internal Medicine) [facility], [place] (Alexander Rosales)  Extended Emergency Contact Information Primary Emergency Contact: Alexander Rosales,Alexander Rosales Address: [address]          [place], [place] [address] Alexander Rosales Phone: [phone] Relation: Other Secondary Emergency Contact: Alexander Rosales Address: [address]          [place], [place] [address] [place] Phone: [phone] Relation: Niece  Code Status:  DNR Goals of care: Advanced Directive information Advanced Directives 01/01/2018  Does Patient Have a Medical Advance Directive? Yes  Type of Advance Directive Out of facility DNR (pink MOST or yellow form)  Does patient want to make changes to medical advance directive? -  Would patient like information on creating a medical advance directive? -  Pre-existing out of facility DNR order (yellow form or pink MOST form) Pink MOST form placed in chart (order not valid for inpatient use);Yellow form placed in chart (order not valid for inpatient use)     Chief Complaint  Patient presents with  . Medical Management of Chronic Issues    [facility]    HPI:  Pt is a 84 y.o. male seen today for medical management of chronic diseases. He is c/a BM in brief this AM. No recent falls. No nursing issues. Appetite ok and sleeps well. He is a poor historian due to dementia/psych d/o. Hx obtained from chart. He is blind and deaf.  BPH with LUTS- chronic foley cath in place. He takes flomax 0.4 mg daily. He has hx urinary retention   Hx CKD stage 3/hx malignant neoplasm of left kidney - s/p partial  left nephrectomy; Cr 1.04  FTT - albumin 3.5; weight trending up. He completed remeron tx. He gets  nutritional supplements per facility protocol.  HTN - BP stable on lotrel 10/20 mg daily; apresoline 25 mg twice daily; lopressor 100 mg daily.   CAD - asymptomatic; takes lopressor 100 mg daily. He has an ASA allergy  GERD - controlled on zantac 150 mg daily   Chronic diarrhea - controlled on imodium 2 mg daily   Deaf/mute/blind - unchanged  Dementia - unchanged. He gets nutritional supplements per facility protocol. Weight stable. BIMS score 0/15     Past Medical History:  Diagnosis Date  . Acute kidney failure ([place])   . Adult failure to thrive   . Anemia, unspecified   . Benign prostatic hyperplasia with lower urinary tract symptoms 10/01/2016  . Blind   . CAD (coronary artery disease)   . Chronic kidney disease   . Coronary atherosclerosis 04/10/2006   Qualifier: Diagnosis of  By: Hilma Favors  DO, Beth    . Deaf   . GERD (gastroesophageal reflux disease)   . Hyperkalemia   . Hyperlipidemia   . Hypertension   . Malignant neoplasm of kidney ([place])   . Mute   . Urinary tract infection, site not specified    Past Surgical History:  Procedure Laterality Date  . CHOLECYSTECTOMY    . left partial nephrectomy      Allergies  Allergen Reactions  . Aspirin Other (See Comments)    Reaction:  Unknown     Outpatient Encounter Medications as of 12/25/2017  Medication Sig  . acetaminophen (TYLENOL) 325 MG tablet Take 650 mg by mouth every 4 (four) hours as needed for mild pain.  Marland Kitchen amLODipine-benazepril (LOTREL) 10-20 MG per capsule Take 1 capsule by mouth daily.  . Cranberry 450 MG CAPS Take 1 tablet by mouth daily.  . hydrALAZINE (APRESOLINE) 25 MG tablet Take 1 tablet (25 mg total) by mouth 2 (two) times daily.  Marland Kitchen loperamide (IMODIUM A-D) 2 MG tablet Take 2 mg by mouth daily as needed for diarrhea or loose stools.  . metoprolol (LOPRESSOR) 100 MG tablet Take 100 mg by mouth daily.  . Nutritional Supplements (NUTRITIONAL SUPPLEMENT PO) Take by mouth. Ensure -  Give 120 cc by  mouth three times daily  . ranitidine (ZANTAC) 150 MG tablet Take 150 mg by mouth at bedtime.  . Tamsulosin HCl (FLOMAX) 0.4 MG CAPS Take 1 capsule (0.4 mg total) by mouth daily.  . Vitamin D, Ergocalciferol, (DRISDOL) 50000 units CAPS capsule Take 50,000 Units by mouth every 30 (thirty) days.   No facility-administered encounter medications on file as of 12/25/2017.     Review of Systems  Unable to perform ROS: Dementia    Immunization History  Administered Date(s) Administered  . Influenza Split 08/19/2011  . PPD Test 10/17/2016  . Pneumococcal-Unspecified 12/25/2016   Pertinent  Health Maintenance Due  Topic Date Due  . PNA vac Low Risk Adult (2 of 2 - PCV13) 12/25/2017  . INFLUENZA VACCINE  01/22/2018   Fall Risk  01/01/2018 12/23/2016  Falls in the past year? No No   Functional Status Survey:    Vitals:   12/25/17 1610  BP: 120/80  Pulse: 70  Temp: 98 F (36.7 C)  SpO2: 98%  Weight: 101 lb (45.8 kg)   Body mass index is 16.3 kg/m. Physical Exam  Constitutional: He appears well-developed.  Frail appearing, lying in bed in NAD  HENT:  Mouth/Throat: Oropharynx is clear and moist.  MMM; no oral thrush  Eyes:  He is blind in OU  Neck: Neck supple. Carotid bruit is not present. No thyromegaly present.  Cardiovascular: Normal rate, regular rhythm and intact distal pulses. Exam reveals no gallop and no friction rub.  Murmur (1/6 SEM) heard. no distal LE swelling. No calf TTP  Pulmonary/Chest: Effort normal and breath sounds normal. No stridor. No respiratory distress. He has no wheezes. He has no rales. He exhibits no tenderness.  Abdominal: Soft. Bowel sounds are normal. He exhibits no distension, no abdominal bruit, no pulsatile midline mass and no mass. There is no hepatomegaly. There is no tenderness. There is no rebound and no guarding. No hernia.  Lymphadenopathy:    He has no cervical adenopathy.  Neurological: He is alert. He has normal reflexes.  Skin: Skin  is warm and dry. No rash noted.  Psychiatric: He has a normal mood and affect. His behavior is normal.  Speech unintelligible    Labs reviewed: Recent Labs    04/02/17 1315 06/07/17 06/09/17 1445  NA 135 145 140  K 4.0 4.5 3.7  CL 103  --  108  CO2 22  --  25  GLUCOSE 108*  --  93  BUN 20 20 22*  CREATININE 1.10 1.0 1.04  CALCIUM 8.9  --  9.0   Recent Labs    04/02/17 1315 06/07/17 06/09/17 1445  AST 14* 10* 18  ALT 10* 8* 13*  ALKPHOS 66 61 67  BILITOT 1.5*  --  0.6  PROT 7.4  --  7.2  ALBUMIN 3.8  --  3.5   Recent Labs    02/20/17 04/02/17 1315 06/09/17 1445  WBC 8.2 13.0* 10.3  NEUTROABS 5 9.9* 5.9  HGB 13.7 13.8 12.7*  HCT 42 40.9 37.8*  MCV  --  84.7 83.6  PLT 253 311 292   Lab Results  Component Value Date   TSH 2.05 02/20/2017   Lab Results  Component Value Date   HGBA1C  03/07/2009    6.1 (NOTE) The ADA recommends the following therapeutic goal for glycemic control related to Hgb A1c measurement: Goal of therapy: <6.5 Hgb A1c  Reference: American Diabetes Association: Clinical Practice Recommendations 2010, Diabetes Care, 2010, 33: (Suppl  1).   Lab Results  Component Value Date   CHOL 118 06/07/2017   HDL 40 06/07/2017   LDLCALC 59 06/07/2017   TRIG 95 06/07/2017   CHOLHDL 3.7 08/19/2011    Significant Diagnostic Results in last 30 days:  No results found.  Assessment/Plan   ICD-10-CM   1. Dementia without behavioral disturbance, unspecified dementia type F03.90   2. Essential hypertension, benign I10   3. BPH with urinary obstruction N40.1    N13.8   4. CKD (chronic kidney disease), stage III (HCC) N18.3   5. GERD without esophagitis K21.9   6. Chronic diarrhea K52.9    Cont nutritional supplements as ordered  Cont current meds as ordered  PT/OT/ST as indicated  F/u with specialists as indicated  [facility] NP to follow  Will follow  Labs/tests ordered: cmp   Shenica Holzheimer S. Perlie Gold  [facility] [address] [place], [place] [address] [phone] Cell (Monday-Friday 8 AM - 5 PM) [phone] After 5 PM and follow prompts

## 2018-02-11 ENCOUNTER — Encounter: Payer: Self-pay | Admitting: Internal Medicine

## 2018-02-26 DIAGNOSIS — R338 Other retention of urine: Secondary | ICD-10-CM | POA: Diagnosis not present

## 2018-03-05 ENCOUNTER — Non-Acute Institutional Stay (SKILLED_NURSING_FACILITY): Payer: Medicare Other | Admitting: Internal Medicine

## 2018-03-05 ENCOUNTER — Encounter: Payer: Self-pay | Admitting: Internal Medicine

## 2018-03-05 DIAGNOSIS — N401 Enlarged prostate with lower urinary tract symptoms: Secondary | ICD-10-CM

## 2018-03-05 DIAGNOSIS — N138 Other obstructive and reflux uropathy: Secondary | ICD-10-CM

## 2018-03-05 DIAGNOSIS — R627 Adult failure to thrive: Secondary | ICD-10-CM | POA: Diagnosis not present

## 2018-03-05 DIAGNOSIS — I10 Essential (primary) hypertension: Secondary | ICD-10-CM

## 2018-03-05 DIAGNOSIS — F0390 Unspecified dementia without behavioral disturbance: Secondary | ICD-10-CM

## 2018-03-05 NOTE — Progress Notes (Signed)
Location:  [facility] Room Number: [place] of Service:  SNF [phone]) Provider:  Wallace, [place], DO  Patient Care Team: Gildardo Cranker, DO as PCP - General (Internal Medicine) [facility], [place] ([place])  Extended Emergency Contact Information Primary Emergency Contact: Steve,[name] Address: [address]          [place], [place] [address] [place] Phone: [phone] Relation: Other Secondary Emergency Contact: [name] Address: [address]          [place], [place] [address] [place] Phone: [phone] Relation: Niece  Code Status:  DNR Goals of care: Advanced Directive information Advanced Directives 03/05/2018  Does Patient Have a Medical Advance Directive? Yes  Type of Advance Directive Out of facility DNR (pink MOST or yellow form)  Does patient want to make changes to medical advance directive? No - Patient declined  Would patient like information on creating a medical advance directive? -  Pre-existing out of facility DNR order (yellow form or pink MOST form) Yellow form placed in chart (order not valid for inpatient use);Pink MOST form placed in chart (order not valid for inpatient use)     Chief Complaint  Patient presents with  . Medical Management of Chronic Issues    [facility]    HPI:  Pt is a 84 y.o. male seen today for medical management of chronic diseases.  He has no concerns. No nursing issues. No falls. Appetite ok and sleeps well. He is a poor historian due to dementia. Hx obtained from chart. AWV completed in 12/2017  BPH with LUTS- chronic foley cath in place. He takes flomax 0.4 mg daily. He has hx urinary retention   Hx CKD stage 3/hx malignant neoplasm of left kidney - s/p partial  left nephrectomy; Cr 1.04  FTT - albumin 3.5; weight trending up. He completed remeron tx. He gets nutritional supplements per facility protocol.  HTN - BP stable on  lotrel 10/20 mg daily; apresoline 25 mg twice daily; lopressor 100 mg daily.   CAD - asymptomatic; takes lopressor 100 mg daily. He has an ASA allergy  GERD - controlled on zantac 150 mg daily   Chronic diarrhea - controlled on imodium 2 mg daily   Deaf/mute/blind - unchanged  Dementia - unchanged. He gets nutritional supplements per facility protocol. Weight stable. BIMS score 0/15   Past Medical History:  Diagnosis Date  . Acute kidney failure ([place])   . Adult failure to thrive   . Anemia, unspecified   . Benign prostatic hyperplasia with lower urinary tract symptoms 10/01/2016  . Blind   . CAD (coronary artery disease)   . Chronic kidney disease   . Coronary atherosclerosis 04/10/2006   Qualifier: Diagnosis of  By: Hilma Favors  DO, Beth    . Deaf   . GERD (gastroesophageal reflux disease)   . Hyperkalemia   . Hyperlipidemia   . Hypertension   . Malignant neoplasm of kidney ([place])   . Mute   . Urinary tract infection, site not specified    Past Surgical History:  Procedure Laterality Date  . CHOLECYSTECTOMY    . left partial nephrectomy      Allergies  Allergen Reactions  . Aspirin Other (See Comments)    Reaction:  Unknown     Outpatient Encounter Medications as of 03/05/2018  Medication Sig  . acetaminophen (TYLENOL) 325 MG tablet Take 650 mg by mouth 3 (three) times daily.   Marland Kitchen  amLODipine-benazepril (LOTREL) 10-20 MG per capsule Take 1 capsule by mouth daily.  . Cranberry 450 MG CAPS Take 1 tablet by mouth daily.  . hydrALAZINE (APRESOLINE) 25 MG tablet Take 1 tablet (25 mg total) by mouth 2 (two) times daily.  Marland Kitchen loperamide (IMODIUM A-D) 2 MG tablet Take 2 mg by mouth daily as needed for diarrhea or loose stools.  . metoprolol (LOPRESSOR) 100 MG tablet Take 100 mg by mouth daily.  . Nutritional Supplements (NUTRITIONAL SUPPLEMENT PO) Take by mouth. Ensure -  Give 120 cc by mouth three times daily  . Nutritional Supplements (NUTRITIONAL SUPPLEMENT PO) Frozen  Nutritional Treat  - Give 4 oz with meals for weight support  . Nutritional Supplements (NUTRITIONAL SUPPLEMENT PO) Regular Diet - Regular texture  . ranitidine (ZANTAC) 150 MG tablet Take 150 mg by mouth at bedtime.  . Tamsulosin HCl (FLOMAX) 0.4 MG CAPS Take 1 capsule (0.4 mg total) by mouth daily.  . Vitamin D, Ergocalciferol, (DRISDOL) 50000 units CAPS capsule Take 50,000 Units by mouth every 30 (thirty) days.   No facility-administered encounter medications on file as of 03/05/2018.     Review of Systems  Unable to perform ROS: Dementia    Immunization History  Administered Date(s) Administered  . Influenza Split 08/19/2011  . PPD Test 10/17/2016, 02/14/2018  . Pneumococcal-Unspecified 12/25/2016   Pertinent  Health Maintenance Due  Topic Date Due  . PNA vac Low Risk Adult (2 of 2 - PCV13) 04/03/2018 (Originally 12/25/2017)  . INFLUENZA VACCINE  05/04/2018 (Originally 01/22/2018)   Fall Risk  01/01/2018 12/23/2016  Falls in the past year? No No   Functional Status Survey:    Vitals:   03/05/18 1055  BP: (!) 158/77  Pulse: 71  Resp: 18  Temp: (!) 97.5 F (36.4 C)  SpO2: 97%  Weight: 99 lb 8 oz (45.1 kg)  Height: 5\' 6"  (1.676 m)   Body mass index is 16.06 kg/m. Physical Exam  Constitutional: He is oriented to person, place, and time. He appears well-developed and well-nourished.  Frail appearing sitting in w/c in NAD  HENT:  Mouth/Throat: Oropharynx is clear and moist.  MMM; no oral thrush  Eyes:  Blind in OU  Neck: Neck supple. Carotid bruit is not present. No thyromegaly present.  Cardiovascular: Normal rate, regular rhythm, normal heart sounds and intact distal pulses. Exam reveals no gallop and no friction rub.  No murmur heard. no distal LE swelling. No calf TTP  Pulmonary/Chest: Effort normal and breath sounds normal. He has no wheezes. He has no rales. He exhibits no tenderness.  Abdominal: Soft. Bowel sounds are normal. He exhibits no distension, no  abdominal bruit, no pulsatile midline mass and no mass. There is no hepatomegaly. There is no tenderness. There is no rebound and no guarding.  Genitourinary:  Genitourinary Comments: Foley cath intact and DTG clear yellow urine  Musculoskeletal: He exhibits edema (small joints).  Lymphadenopathy:    He has no cervical adenopathy.  Neurological: He is alert and oriented to person, place, and time. He has normal reflexes.  Skin: Skin is warm and dry. No rash noted.  Psychiatric: He has a normal mood and affect. His behavior is normal. Judgment and thought content normal.    Labs reviewed: Recent Labs    04/02/17 1315 06/07/17 06/09/17 1445  NA 135 145 140  K 4.0 4.5 3.7  CL 103  --  108  CO2 22  --  25  GLUCOSE 108*  --  93  BUN 20 20 22*  CREATININE 1.10 1.0 1.04  CALCIUM 8.9  --  9.0   Recent Labs    04/02/17 1315 06/07/17 06/09/17 1445  AST 14* 10* 18  ALT 10* 8* 13*  ALKPHOS 66 61 67  BILITOT 1.5*  --  0.6  PROT 7.4  --  7.2  ALBUMIN 3.8  --  3.5   Recent Labs    04/02/17 1315 06/09/17 1445  WBC 13.0* 10.3  NEUTROABS 9.9* 5.9  HGB 13.8 12.7*  HCT 40.9 37.8*  MCV 84.7 83.6  PLT 311 292   Lab Results  Component Value Date   TSH 2.05 02/20/2017   Lab Results  Component Value Date   HGBA1C  03/07/2009    6.1 (NOTE) The ADA recommends the following therapeutic goal for glycemic control related to Hgb A1c measurement: Goal of therapy: <6.5 Hgb A1c  Reference: American Diabetes Association: Clinical Practice Recommendations 2010, Diabetes Care, 2010, 33: (Suppl  1).   Lab Results  Component Value Date   CHOL 118 06/07/2017   HDL 40 06/07/2017   LDLCALC 59 06/07/2017   TRIG 95 06/07/2017   CHOLHDL 3.7 08/19/2011    Significant Diagnostic Results in last 30 days:  No results found.  Assessment/Plan   ICD-10-CM   1. BPH with urinary obstruction N40.1    N13.8   2. Dementia without behavioral disturbance, unspecified dementia type F03.90   3.  Essential hypertension, benign I10   4. Failure to thrive in adult R62.7     Cont current meds as ordered  Cont nutritional supplements as ordered  Foley cath care as indicated  PT/OT/ST as indicated  [facility] NP to follow  Will follow  Labs/tests ordered: none   Alexander Rosales  [facility] [address] [place], [place] [address] [phone] Cell (Monday-Friday 8 AM - 5 PM) [phone] After 5 PM and follow prompts

## 2018-03-15 ENCOUNTER — Encounter: Payer: Self-pay | Admitting: Internal Medicine

## 2018-05-14 DIAGNOSIS — I739 Peripheral vascular disease, unspecified: Secondary | ICD-10-CM | POA: Diagnosis not present

## 2018-05-14 DIAGNOSIS — L853 Xerosis cutis: Secondary | ICD-10-CM | POA: Diagnosis not present

## 2018-05-14 DIAGNOSIS — B351 Tinea unguium: Secondary | ICD-10-CM | POA: Diagnosis not present

## 2018-09-05 IMAGING — US US RENAL
1 series · 14 of 25 positions shown · non-contrast
Comparison: None.

CLINICAL DATA: Acute renal injury, is elevated BUN and creatinine.

EXAM:
RENAL / URINARY TRACT ULTRASOUND COMPLETE

[Series 1: us renal · 0.17mm/px · 14 of 70 slices shown]
[im 1/70]
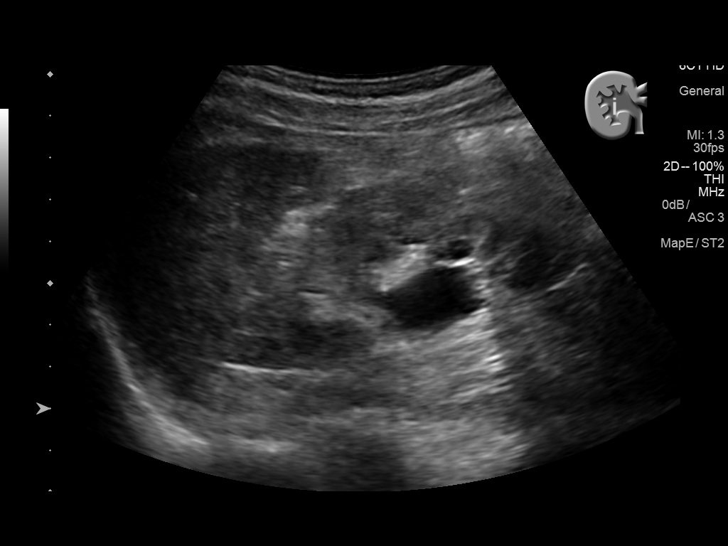
[im 6/70]
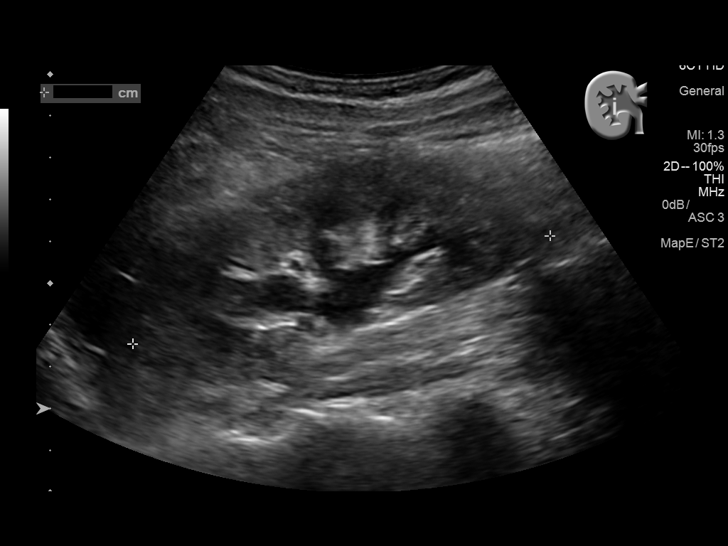
[im 12/70]
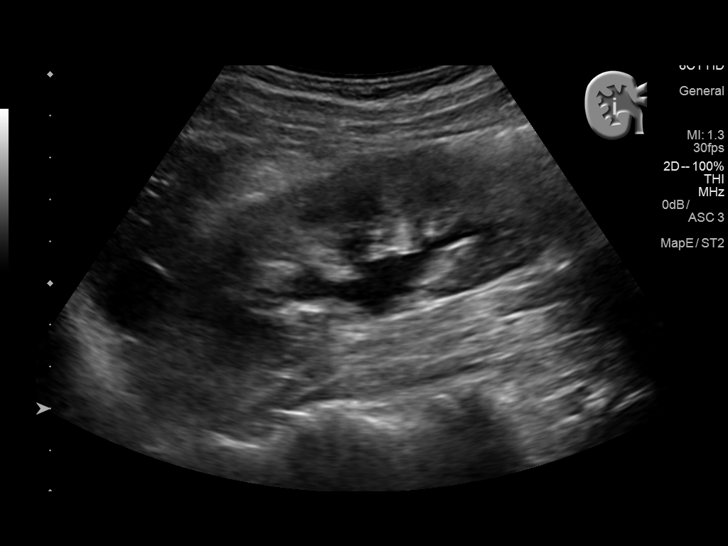
[im 18/70]
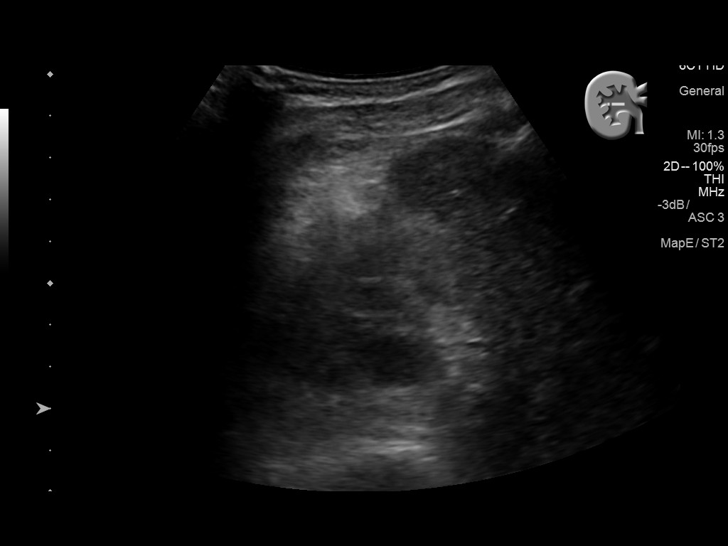
[im 24/70]
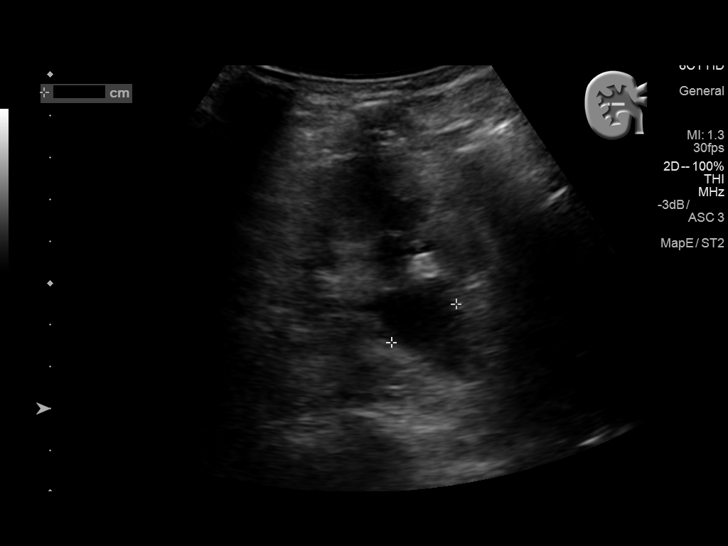
[im 26/70]
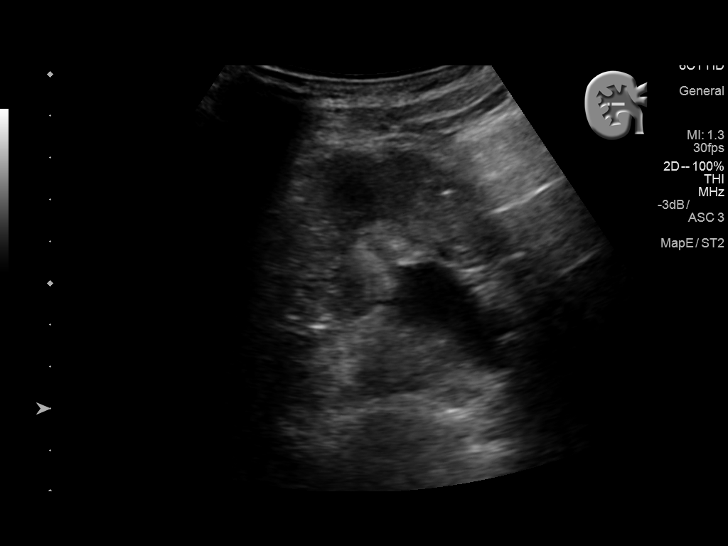
[im 32/70]
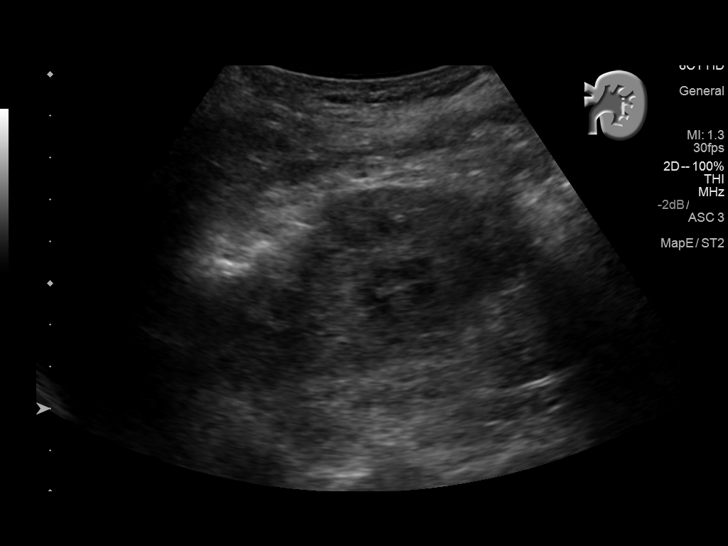
[im 38/70]
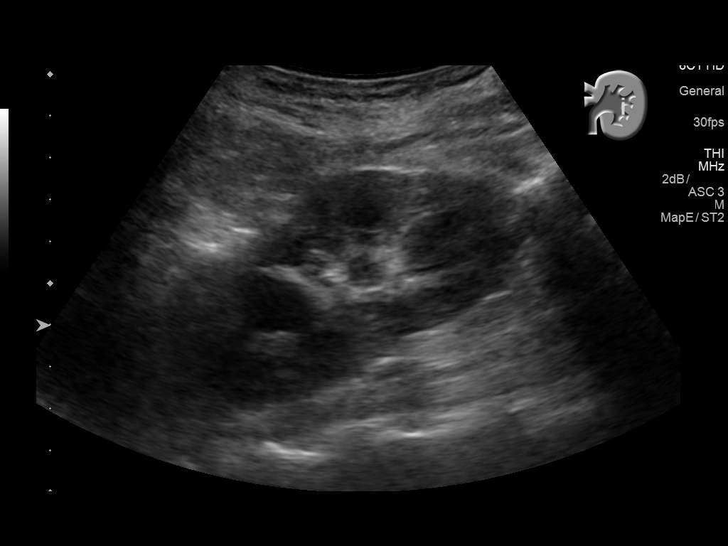
[im 44/70]
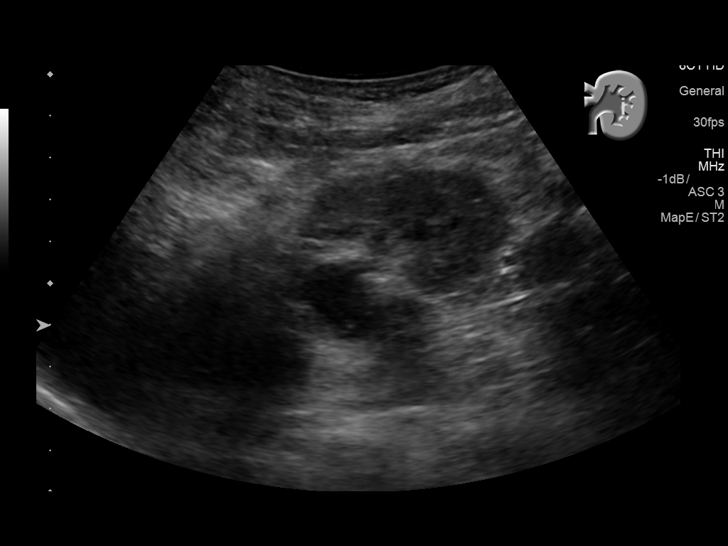
[im 47/70]
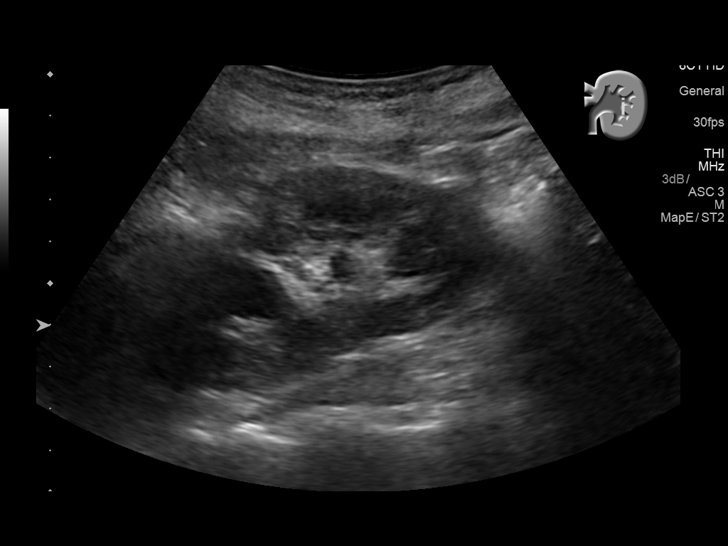
[im 52/70]
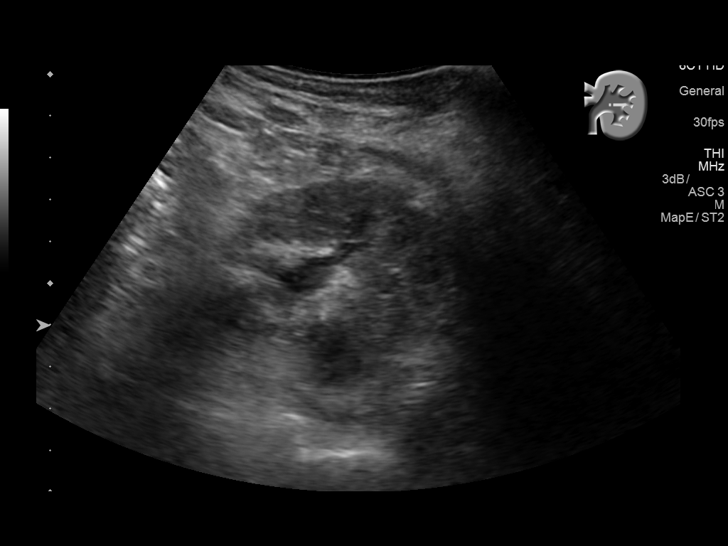
[im 58/70]
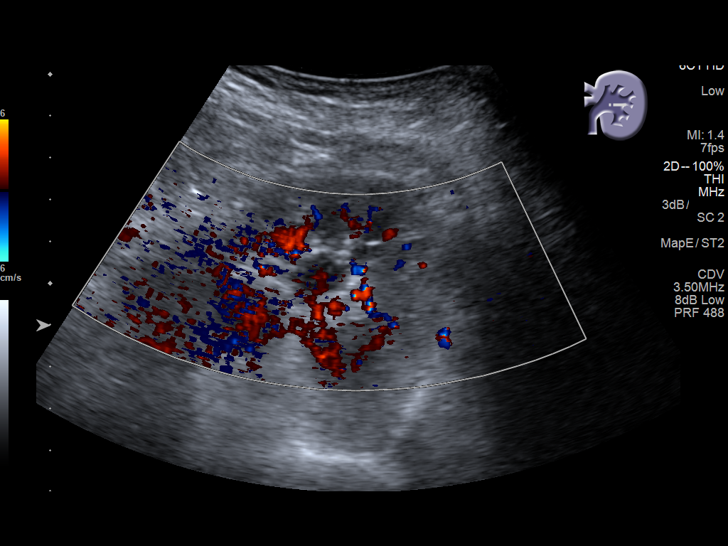
[im 64/70]
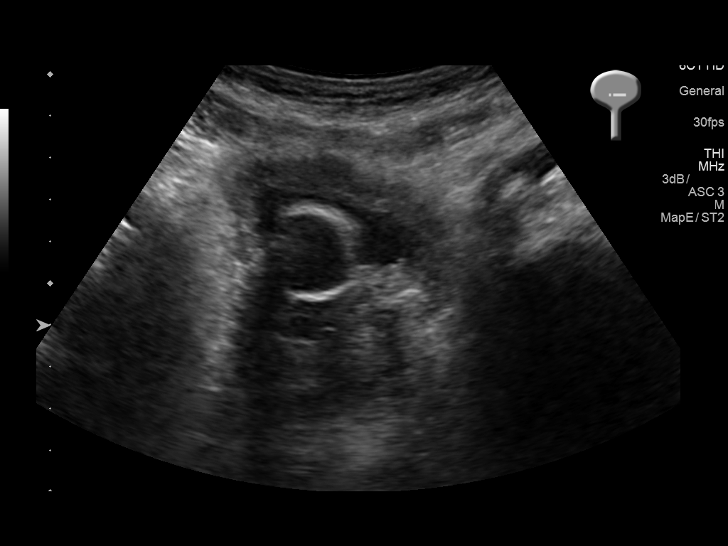
[im 70/70]
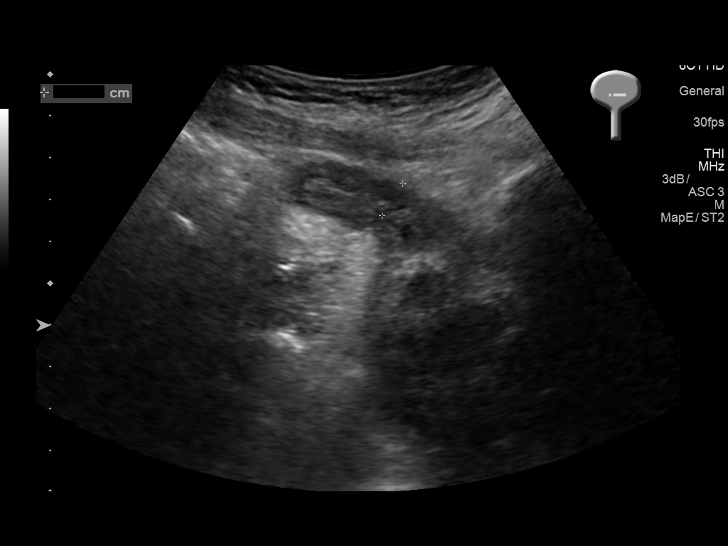

[14 of 25 positions shown; findings below may reference images not displayed]

FINDINGS: Right Kidney:

Length: 10.3 cm. Moderate hydronephrosis. Cortical thickness and
echogenicity is grossly normal.

Left Kidney:

Length: 9.3 cm. Moderate hydronephrosis. Cortical thickness and
echogenicity is grossly normal.

Bladder:

Decompressed by Foley catheter. Bladder wall appears thickened but
this may be accentuated by the decompression.
IMPRESSION: 1. Bilateral hydronephrosis, at least moderate in degree
bilaterally. Consider CT for further characterization.
2. Bladder decompressed by Foley catheter. Questionable bladder wall
thickening.

## 2021-12-19 ENCOUNTER — Emergency Department (HOSPITAL_COMMUNITY)
Admission: EM | Admit: 2021-12-19 | Discharge: 2021-12-19 | Disposition: A | Discharge status: Skilled Nursing Facility | Attending: Emergency Medicine | Admitting: Emergency Medicine

## 2021-12-19 ENCOUNTER — Other Ambulatory Visit: Payer: Self-pay

## 2021-12-19 DIAGNOSIS — N189 Chronic kidney disease, unspecified: Secondary | ICD-10-CM | POA: Diagnosis not present

## 2021-12-19 DIAGNOSIS — T839XXA Unspecified complication of genitourinary prosthetic device, implant and graft, initial encounter: Secondary | ICD-10-CM

## 2021-12-19 DIAGNOSIS — T83098A Other mechanical complication of other indwelling urethral catheter, initial encounter: Secondary | ICD-10-CM | POA: Diagnosis present

## 2021-12-19 DIAGNOSIS — Z79899 Other long term (current) drug therapy: Secondary | ICD-10-CM | POA: Diagnosis not present

## 2021-12-19 DIAGNOSIS — Z978 Presence of other specified devices: Secondary | ICD-10-CM

## 2021-12-19 DIAGNOSIS — I129 Hypertensive chronic kidney disease with stage 1 through stage 4 chronic kidney disease, or unspecified chronic kidney disease: Secondary | ICD-10-CM | POA: Diagnosis not present

## 2021-12-19 DIAGNOSIS — R339 Retention of urine, unspecified: Secondary | ICD-10-CM | POA: Diagnosis not present

## 2021-12-19 NOTE — ED Notes (Signed)
Called to [facility] X 2 no answer, left message on voicemail

## 2021-12-19 NOTE — ED Provider Notes (Signed)
[facility] DEPT Provider Note   CSN: [id number] Arrival date & time: 12/19/21  1652     History  Chief Complaint  Patient presents with   Groin Swelling    Alexander Rosales is a 88 y.o. male.  Patient is a 88 year old male with a history of hypertension, CKD, hyperlipidemia, deafness, blindness, BPH with chronic urinary retention with chronic Foley catheter who is presenting today from his nursing facility because at [facility] today they attempted multiple times to try to place a Foley catheter and were unsuccessful.  They reported that this resulted in swelling of the penis and patient has not voided all day.  No further history is available.  The history is provided by the nursing home and the EMS personnel.       Home Medications Prior to Admission medications   Medication Sig Start Date End Date Taking? Authorizing Provider  acetaminophen (TYLENOL) 325 MG tablet Take 650 mg by mouth 3 (three) times daily.  12/31/17   [provider]  amLODipine-benazepril (LOTREL) 10-20 MG per capsule Take 1 capsule by mouth daily. 08/19/11   Pedro Earls, MD  Cranberry 450 MG CAPS Take 1 tablet by mouth daily.    [provider]  hydrALAZINE (APRESOLINE) 25 MG tablet Take 1 tablet (25 mg total) by mouth 2 (two) times daily. 08/19/11   Pedro Earls, MD  loperamide (IMODIUM A-D) 2 MG tablet Take 2 mg by mouth daily as needed for diarrhea or loose stools.    [provider]  metoprolol (LOPRESSOR) 100 MG tablet Take 100 mg by mouth daily.    [provider]  Nutritional Supplements (NUTRITIONAL SUPPLEMENT PO) Take by mouth. Ensure -  Give 120 cc by mouth three times daily    [provider]  Nutritional Supplements (NUTRITIONAL SUPPLEMENT PO) Frozen Nutritional Treat  - Give 4 oz with meals for weight support    [provider]  Nutritional Supplements (NUTRITIONAL SUPPLEMENT PO) Regular Diet - Regular texture     [provider]  ranitidine (ZANTAC) 150 MG tablet Take 150 mg by mouth at bedtime.    [provider]  Tamsulosin HCl (FLOMAX) 0.4 MG CAPS Take 1 capsule (0.4 mg total) by mouth daily. 09/16/11   Pedro Earls, MD  Vitamin D, Ergocalciferol, (DRISDOL) 50000 units CAPS capsule Take 50,000 Units by mouth every 30 (thirty) days. 07/21/17   [provider]      Allergies    Aspirin    Review of Systems   Review of Systems  Physical Exam Updated Vital Signs BP 129/71 (BP Location: Right Arm)   Pulse 81   Temp 98.1 F (36.7 C) (Axillary)   Resp 17   SpO2 100%  Physical Exam Vitals and nursing note reviewed.  Constitutional:      Comments: Patient appears to be in no acute distress  HENT:     Head: Normocephalic.  Eyes:     Comments: Patient is blind  Cardiovascular:     Rate and Rhythm: Normal rate.  Pulmonary:     Effort: Pulmonary effort is normal.  Abdominal:     General: Abdomen is flat.     Comments: No distended, firm or palpable bladder  Genitourinary:    Comments: Patient is uncircumcised and unable to completely retract the foreskin but blood coming from the penis Skin:    General: Skin is warm.  Neurological:     Mental Status: Mental status is at baseline.  ED Results / Procedures / Treatments   Labs (all labs ordered are listed, but only abnormal results are displayed) Labs Reviewed - No data to display  EKG None  Radiology No results found.  Procedures Procedures    Medications Ordered in ED Medications - No data to display  ED Course/ Medical Decision Making/ A&P                           Medical Decision Making  Pt with multiple medical problems and comorbidities and presenting today with a complaint that caries a high risk for morbidity and mortality.  Patient presenting today with inability to get a Foley catheter in place.  Based on evaluation of external medical records patient has a history of BPH and  chronic Foley.  It appears that it was trying to be replaced today and they were unsuccessful.  Patient is uncircumcised and unable to retract the foreskin but is having blood coming from his penis.  Patient's bedside bladder scan shows a total of 58 mL.  He does not appear to have retention type symptoms at this time however if he has chronic Foley catheter will attempt replacement.  There was report that he had not urinated all day but vital signs are within normal limits. Foley catheter is in place and draining urine.  Patient does not appear to have any acute reason for further investigation at this time and was discharged back to his facility.        Final Clinical Impression(s) / ED Diagnoses Final diagnoses:  Chronic indwelling Foley catheter  Foley catheter problem, initial encounter [facility])    Rx / DC Orders ED Discharge Orders     None         Blanchie Dessert, MD 12/19/21 1935

## 2021-12-19 NOTE — Discharge Instructions (Signed)
If you start having fever, the blood in the urine does not clear, change in mental status return to the emergency room

## 2021-12-19 NOTE — ED Notes (Signed)
[facility] called and transportation set up, pt is number 6 on list.

## 2021-12-19 NOTE — ED Triage Notes (Signed)
Pt. Arrived from [place] by [facility]. Via EMS the nursing home tried multiple times to place a foley and way unsuccessful. This resulted in swelling in the penis and no voiding at all today. Pt. Is Blind, deaf and cannot talk.

## 2022-04-24 DEATH — deceased
